# Patient Record
Sex: Female | Born: 1965 | Race: White | Hispanic: No | Marital: Married | Smoking: Current some day smoker
Health system: Southern US, Community
[De-identification: ages and names within clinical notes are randomized; demographics above are authoritative.]

## PROBLEM LIST (undated history)

## (undated) DIAGNOSIS — Z8489 Family history of other specified conditions: Secondary | ICD-10-CM

## (undated) DIAGNOSIS — M1990 Unspecified osteoarthritis, unspecified site: Secondary | ICD-10-CM

## (undated) DIAGNOSIS — R51 Headache: Secondary | ICD-10-CM

## (undated) DIAGNOSIS — R42 Dizziness and giddiness: Secondary | ICD-10-CM

## (undated) DIAGNOSIS — Z9889 Other specified postprocedural states: Secondary | ICD-10-CM

## (undated) DIAGNOSIS — R519 Headache, unspecified: Secondary | ICD-10-CM

## (undated) DIAGNOSIS — E039 Hypothyroidism, unspecified: Secondary | ICD-10-CM

## (undated) DIAGNOSIS — R112 Nausea with vomiting, unspecified: Secondary | ICD-10-CM

## (undated) HISTORY — PX: HYSTERECTOMY ABDOMINAL WITH SALPINGECTOMY: SHX6725

## (undated) HISTORY — PX: WRIST SURGERY: SHX841

## (undated) HISTORY — PX: TONSILLECTOMY: SUR1361

---

## 2004-03-18 ENCOUNTER — Ambulatory Visit: Payer: Self-pay | Admitting: Specialist

## 2004-10-21 ENCOUNTER — Ambulatory Visit: Payer: Self-pay | Admitting: Physician Assistant

## 2005-02-23 ENCOUNTER — Ambulatory Visit: Payer: Self-pay | Admitting: Unknown Physician Specialty

## 2006-03-01 ENCOUNTER — Emergency Department: Payer: Self-pay | Admitting: Emergency Medicine

## 2006-08-12 ENCOUNTER — Emergency Department: Payer: Self-pay | Admitting: Unknown Physician Specialty

## 2006-08-12 ENCOUNTER — Other Ambulatory Visit: Payer: Self-pay

## 2007-08-06 ENCOUNTER — Ambulatory Visit: Payer: Self-pay | Admitting: Internal Medicine

## 2007-08-09 ENCOUNTER — Ambulatory Visit: Payer: Self-pay | Admitting: Internal Medicine

## 2007-12-19 ENCOUNTER — Ambulatory Visit: Payer: Self-pay | Admitting: Specialist

## 2008-07-01 DIAGNOSIS — K219 Gastro-esophageal reflux disease without esophagitis: Secondary | ICD-10-CM | POA: Insufficient documentation

## 2008-07-01 DIAGNOSIS — Z720 Tobacco use: Secondary | ICD-10-CM | POA: Insufficient documentation

## 2008-07-08 ENCOUNTER — Ambulatory Visit: Payer: Self-pay | Admitting: Family Medicine

## 2008-08-07 DIAGNOSIS — E782 Mixed hyperlipidemia: Secondary | ICD-10-CM | POA: Insufficient documentation

## 2008-08-07 DIAGNOSIS — E039 Hypothyroidism, unspecified: Secondary | ICD-10-CM | POA: Insufficient documentation

## 2008-08-11 ENCOUNTER — Ambulatory Visit: Payer: Self-pay | Admitting: Family Medicine

## 2008-09-23 ENCOUNTER — Ambulatory Visit: Payer: Self-pay | Admitting: Family Medicine

## 2008-10-09 DIAGNOSIS — F419 Anxiety disorder, unspecified: Secondary | ICD-10-CM | POA: Insufficient documentation

## 2008-10-22 ENCOUNTER — Ambulatory Visit: Payer: Self-pay | Admitting: Unknown Physician Specialty

## 2009-01-19 ENCOUNTER — Ambulatory Visit: Payer: Self-pay | Admitting: Family Medicine

## 2009-02-10 ENCOUNTER — Ambulatory Visit: Payer: Self-pay | Admitting: Family Medicine

## 2009-09-02 DIAGNOSIS — J309 Allergic rhinitis, unspecified: Secondary | ICD-10-CM | POA: Insufficient documentation

## 2009-11-17 ENCOUNTER — Ambulatory Visit: Payer: Self-pay | Admitting: Family Medicine

## 2010-05-25 ENCOUNTER — Ambulatory Visit: Payer: Self-pay | Admitting: Family Medicine

## 2010-08-08 IMAGING — CT CT CHEST W/ CM
1 series · 15 of 34 positions shown, 19 images · IV contrast (APPLIED)
Comparison: none

REASON FOR EXAM: CP   shortness of breath
COMMENTS:

[Series 8: soft tissue · axial · 0.80mm/px · z∈[-624,-381]mm · 15 of 95 slices shown, 19 images]
[im 7/95  mediastinal]
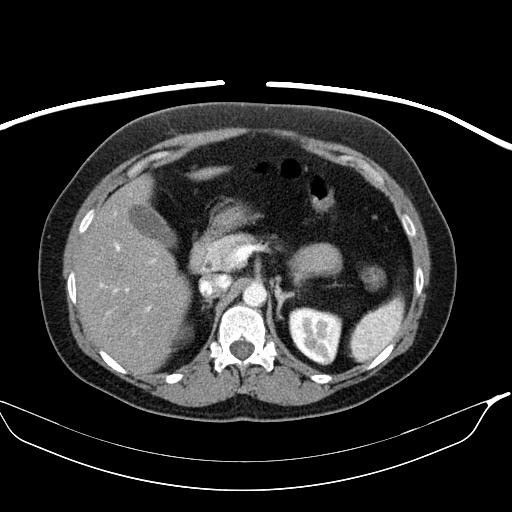
[im 7/95  lung]
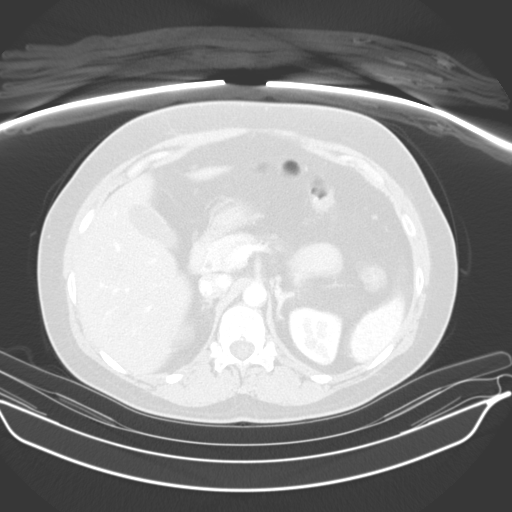
[im 14/95  lung]
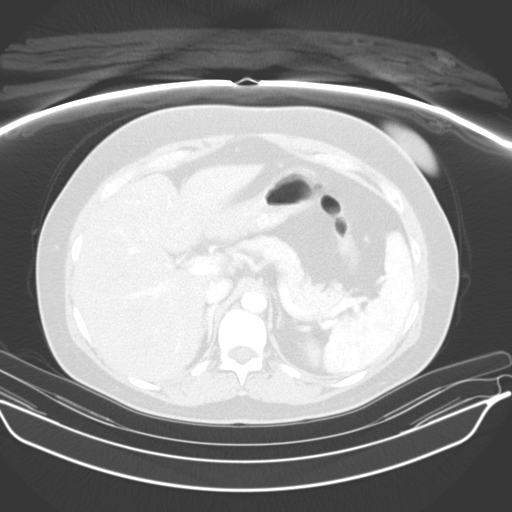
[im 19/95  lung]
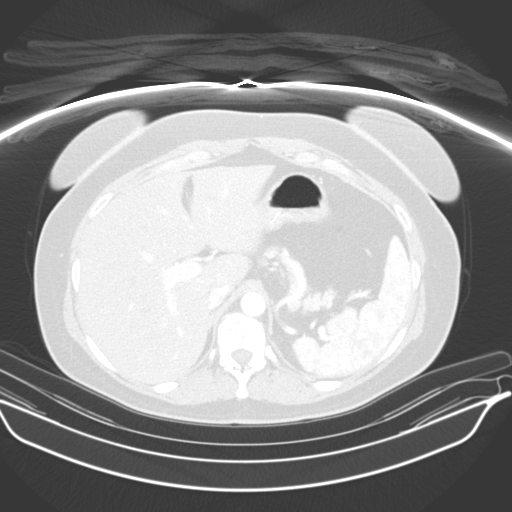
[im 25/95  lung]
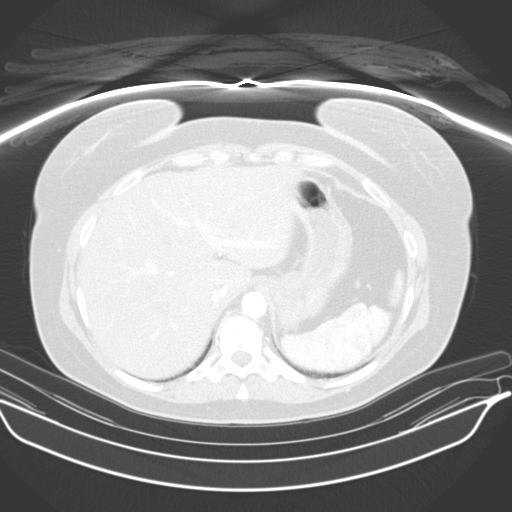
[im 32/95  mediastinal]
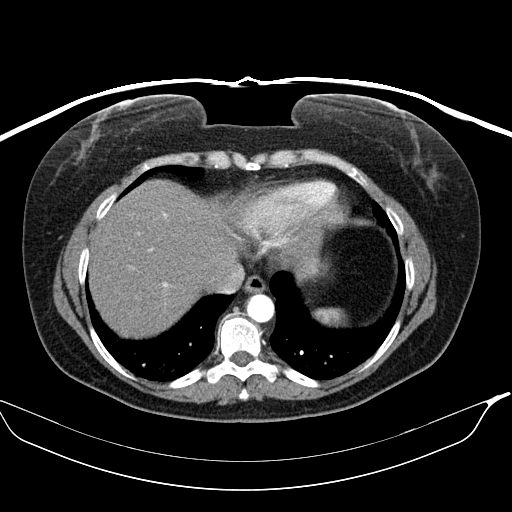
[im 32/95  lung]
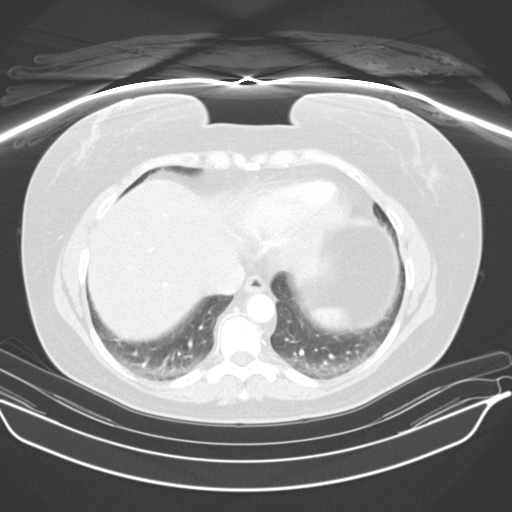
[im 38/95  lung]
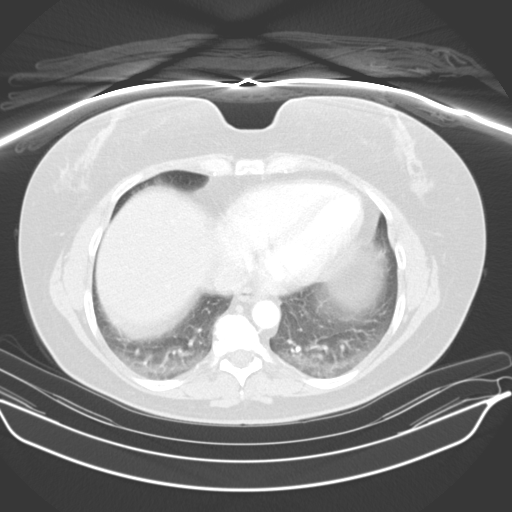
[im 42/95  lung]
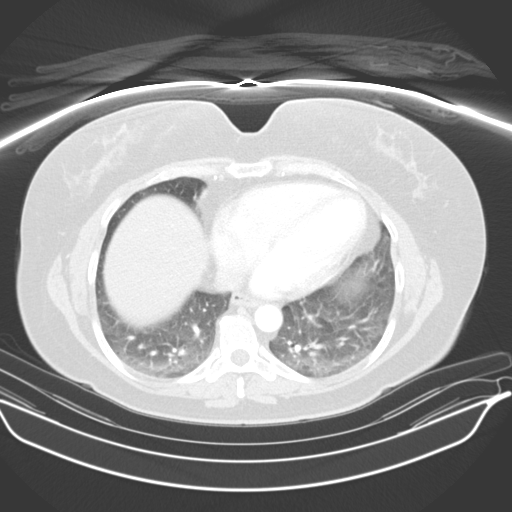
[im 49/95  lung]
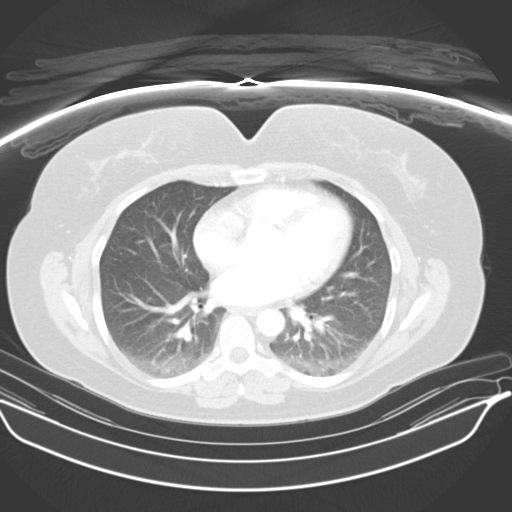
[im 53/95  mediastinal]
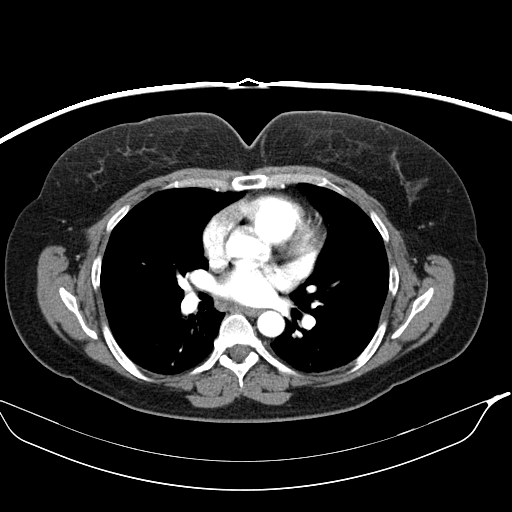
[im 53/95  lung]
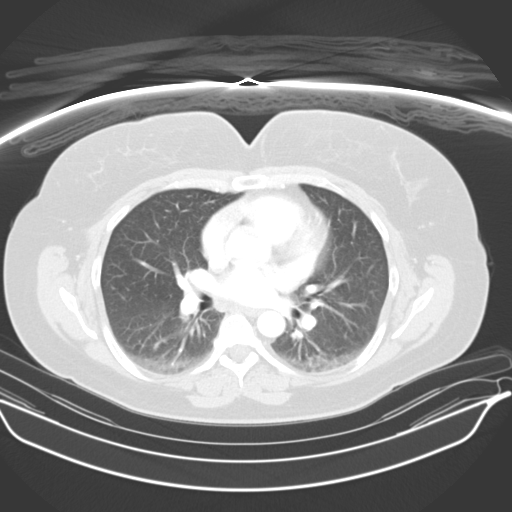
[im 57/95  lung]
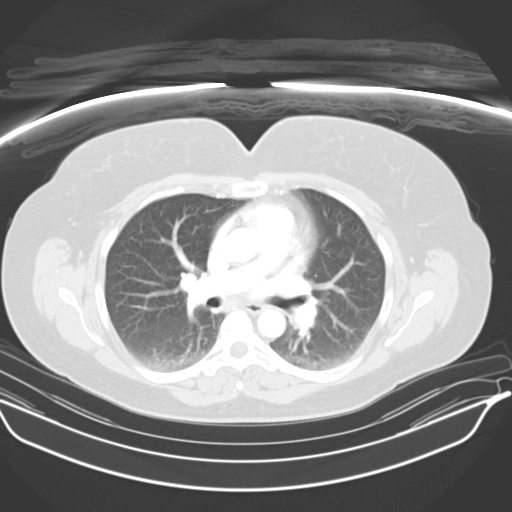
[im 63/95  lung]
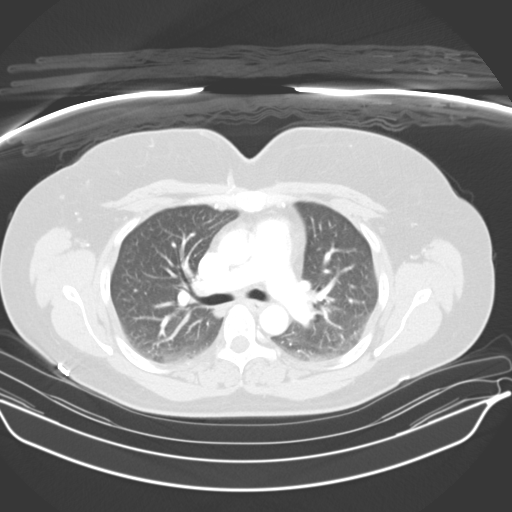
[im 70/95  lung]
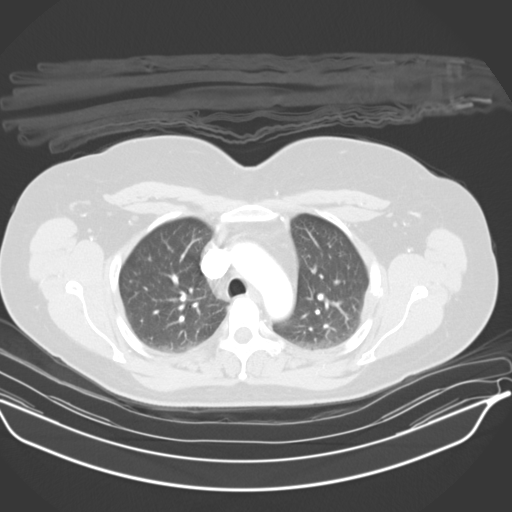
[im 76/95  mediastinal]
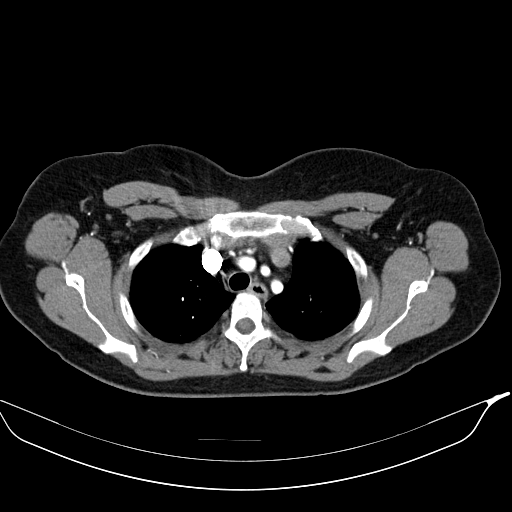
[im 76/95  lung]
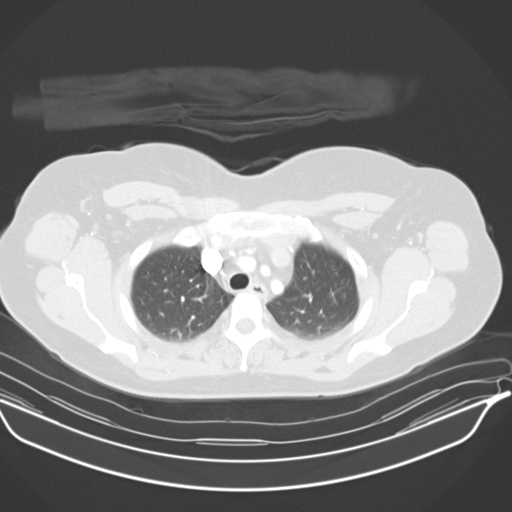
[im 81/95  lung]
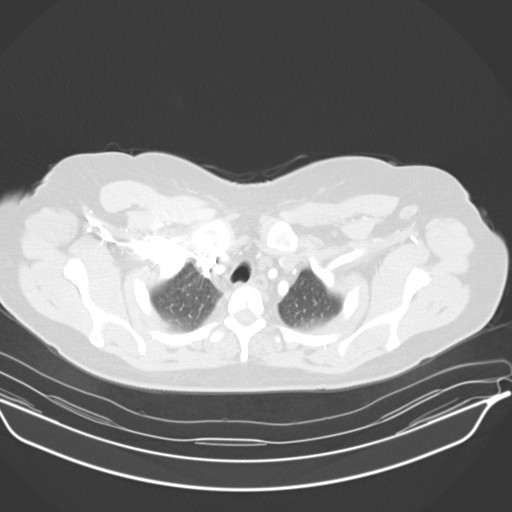
[im 88/95  lung]
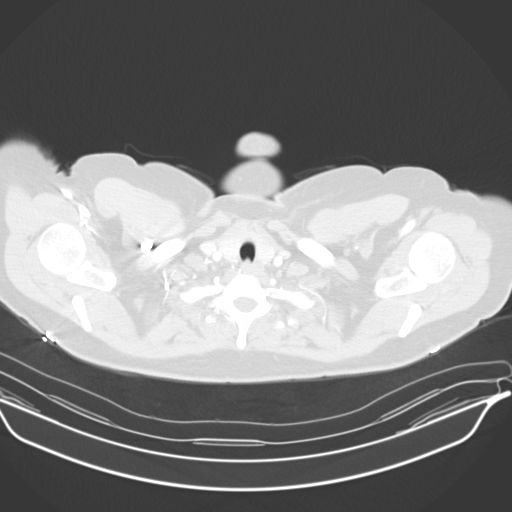

[15 of 34 positions shown; findings below may reference images not displayed]

PROCEDURE:     LUKENS - LUKENS CHEST ( FOR PE ) W  - February 10, 2009  [DATE]

RESULT:     CT of the chest is performed utilizing a pulmonary embolism
protocol and 100 ml of [id number] iodinated intravenous contrast. There is
no previous examination for comparison.

Lung window images demonstrate respiratory motion artifact and dependent
atelectasis. A discrete pulmonary mass is not evident. No endobronchial
lesion is demonstrated. The thoracic aorta opacifies normally without
evidence of aneurysm or dissection. The pulmonary arteries are also well
opacified and demonstrate no filling defect to suggest a pulmonary embolism.
There is no mediastinal or hilar mass or adenopathy. The thyroid lobes
appear to enhance normally. There is no pleural or pericardial effusion. The
included portions of the upper abdomen appear within normal limits.
IMPRESSION: 1.     No aortic aneurysm, aortic dissection or pulmonary embolism is
demonstrated. There is respiratory motion artifact and dependent atelectasis.

## 2011-11-20 IMAGING — CR DG LUMBAR SPINE 2-3V
1 series · 3 of 3 positions shown · non-contrast
Comparison: none

REASON FOR EXAM: back pain
COMMENTS:

PROCEDURE:     KDR - KDXR LUMBAR SPINE AP AND LATERAL  - May 25, 2010  [DATE]
RESULT:     The lumbar vertebral bodies are preserved in height. The
intervertebral disc space heights are well-maintained. I see no pars defects
nor spondylolisthesis. The SI joints are normal in appearance.

[Series 1: view not recorded · 0.17mm/px · 3 of 3 slices shown]
[im 1/3]
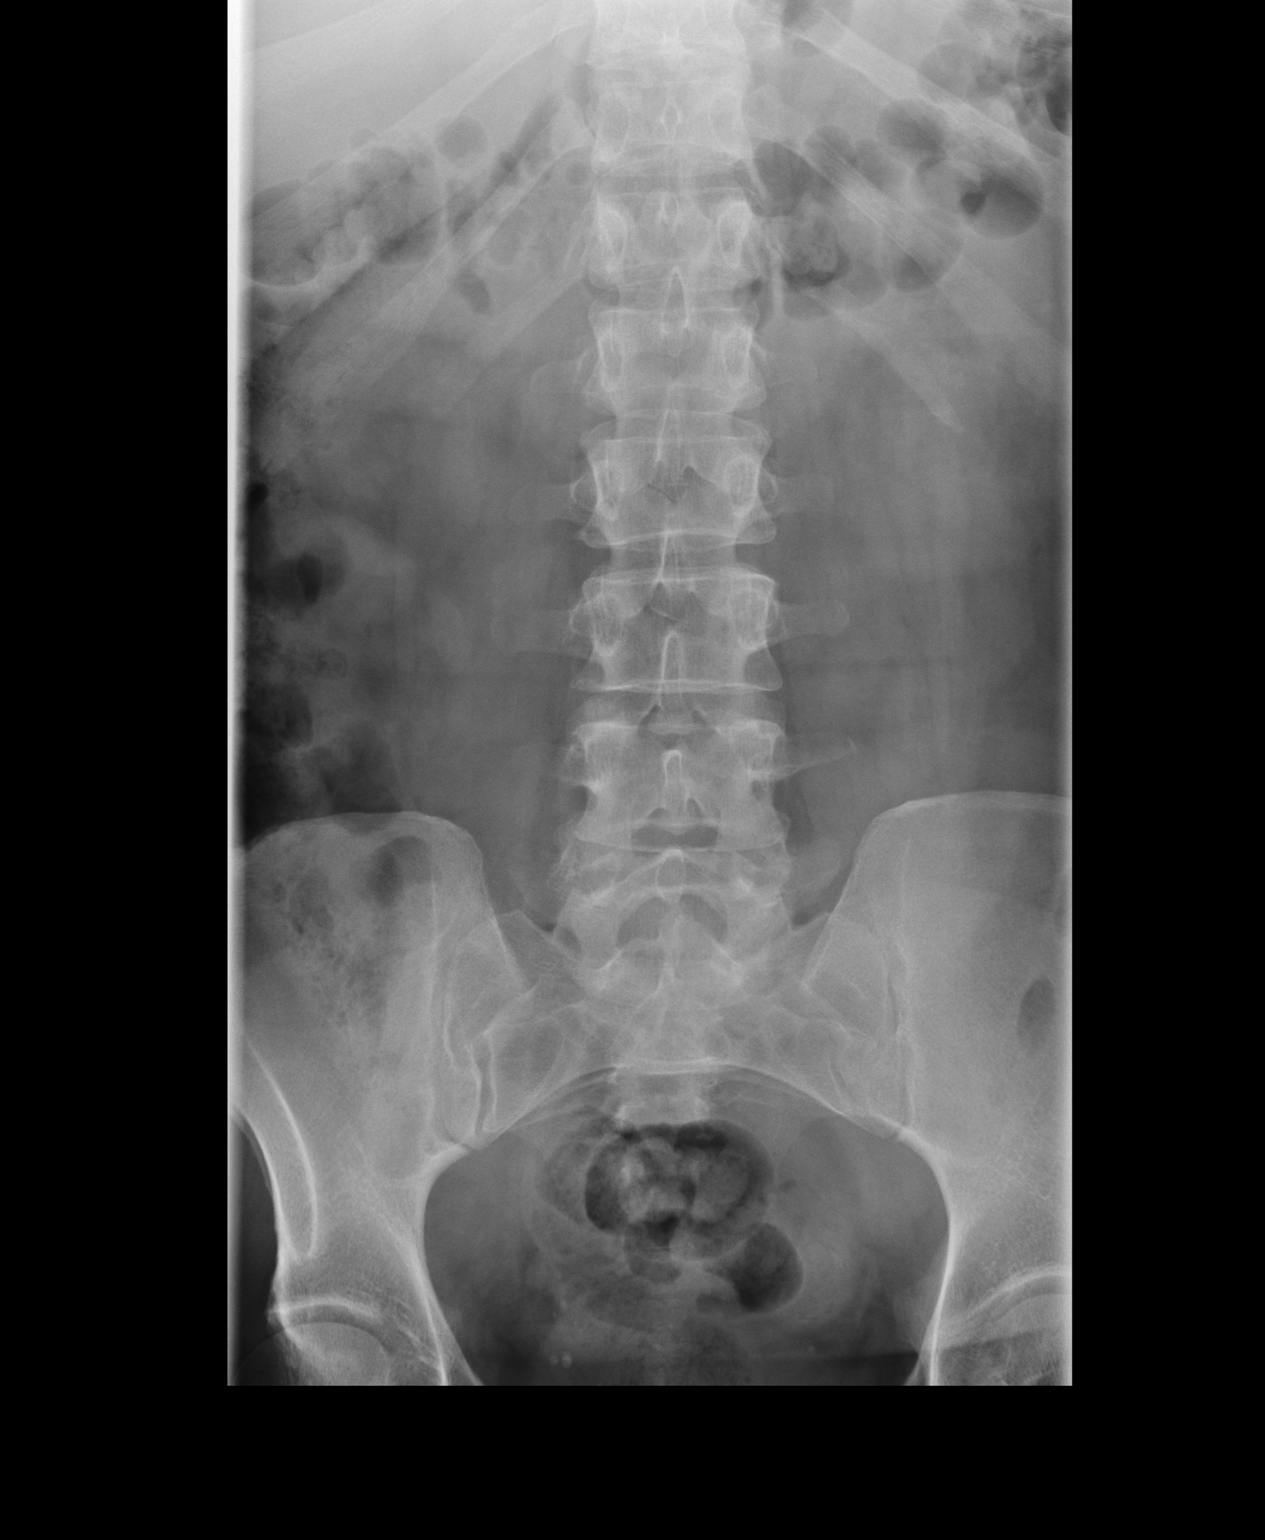
[im 2/3]
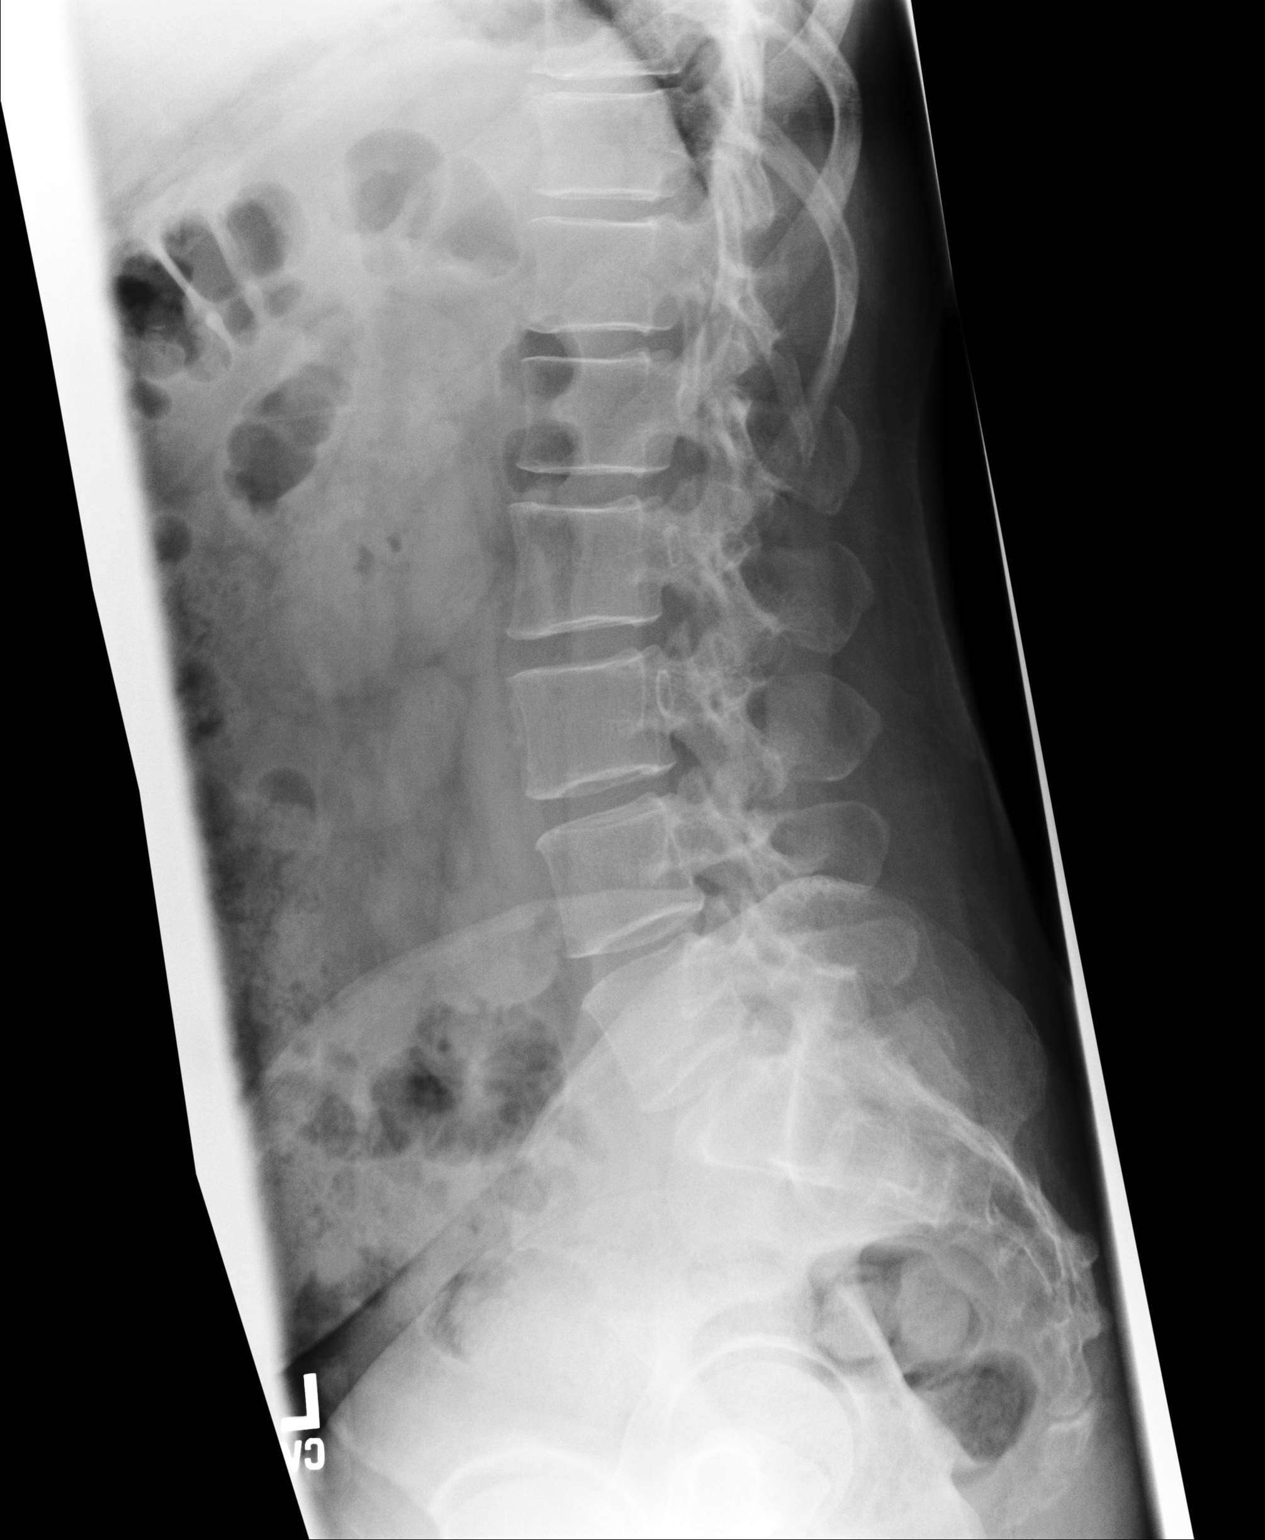
[im 3/3]
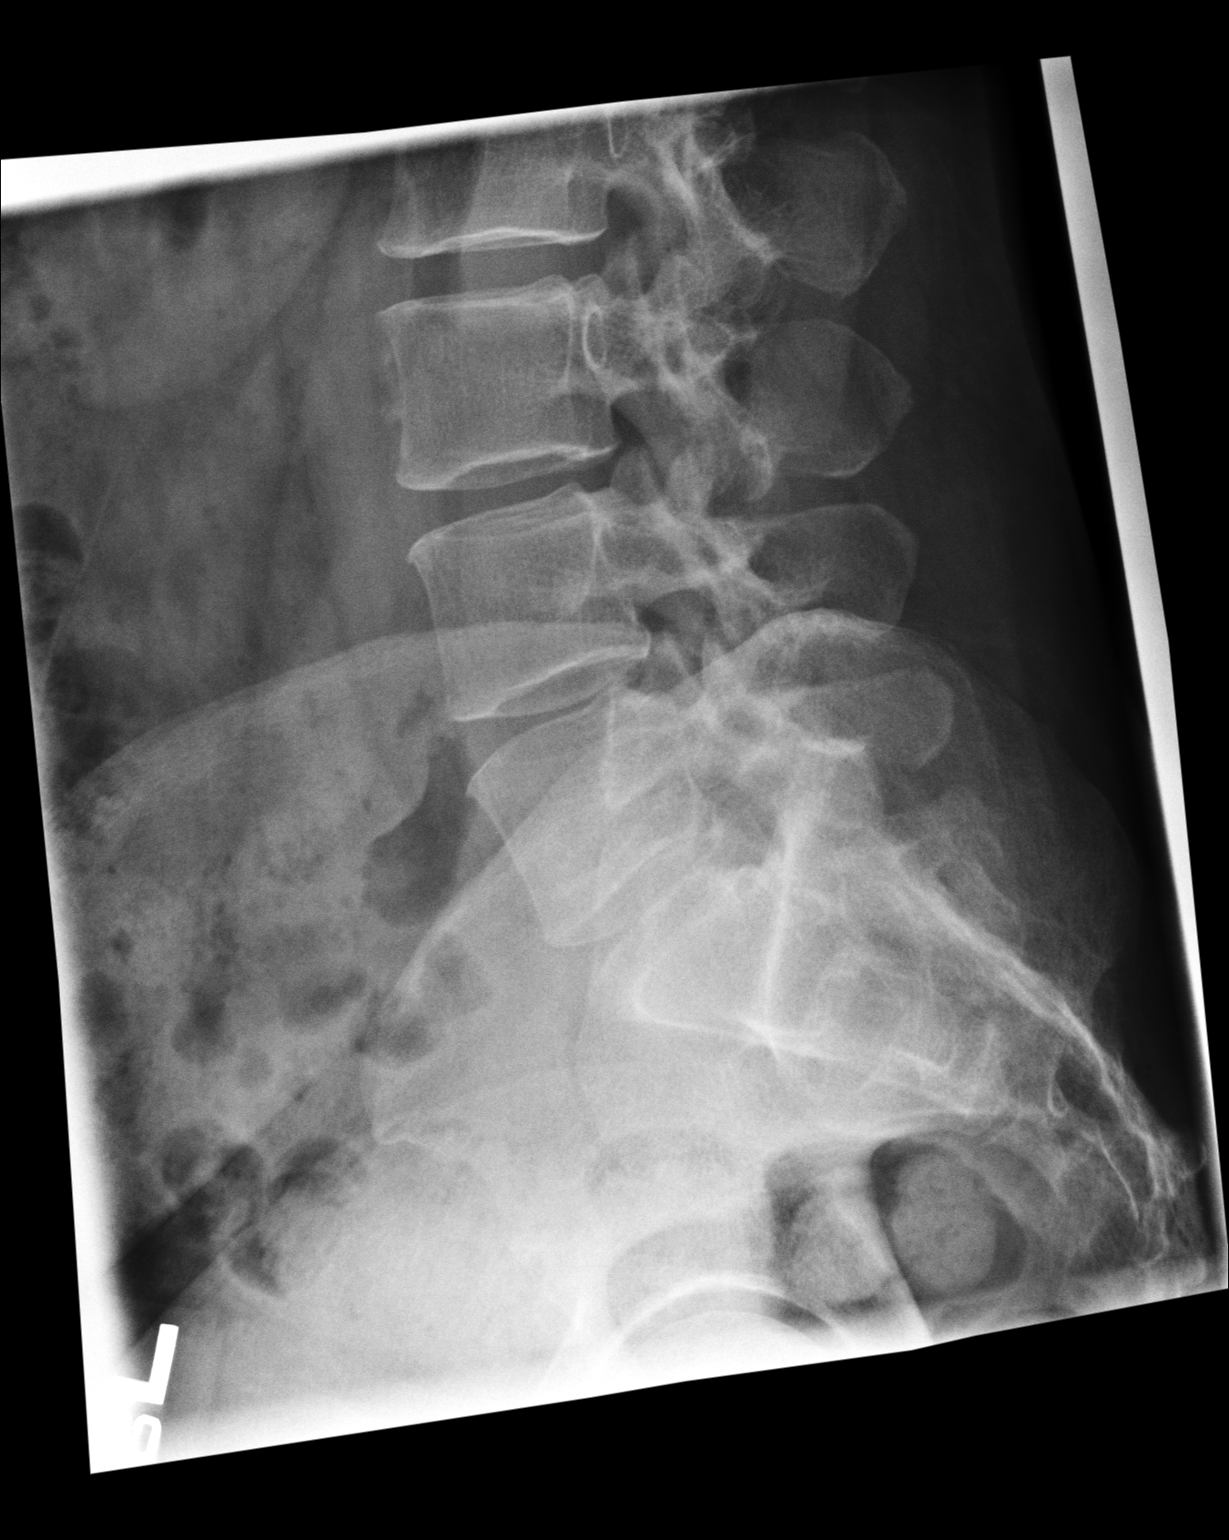

[3 of 3 positions shown; findings below may reference images not displayed]

IMPRESSION: I see no acute bony abnormality of the lumbar spine. If
there are radicular symptoms, followup MRI may be useful.

## 2012-01-24 ENCOUNTER — Ambulatory Visit: Payer: Self-pay | Admitting: Family Medicine

## 2014-09-15 ENCOUNTER — Ambulatory Visit
Admit: 2014-09-15 | Disposition: A | Discharge status: Home / Self Care | Payer: Self-pay | Attending: Family Medicine | Admitting: Family Medicine

## 2014-11-17 ENCOUNTER — Other Ambulatory Visit: Payer: Self-pay | Admitting: Family Medicine

## 2014-11-17 DIAGNOSIS — G4700 Insomnia, unspecified: Secondary | ICD-10-CM | POA: Insufficient documentation

## 2014-11-17 NOTE — Telephone Encounter (Signed)
Printed, please fax or call in to pharmacy. Thank you.   

## 2015-03-05 ENCOUNTER — Other Ambulatory Visit: Payer: Self-pay

## 2015-03-05 DIAGNOSIS — E039 Hypothyroidism, unspecified: Secondary | ICD-10-CM

## 2015-03-05 MED ORDER — LEVOTHYROXINE SODIUM 75 MCG PO TABS: 75.0000 ug | ORAL_TABLET | Freq: Every day | ORAL | Status: DC

## 2015-03-05 NOTE — Telephone Encounter (Signed)
Last OV 05/2014  Thanks,   -Laura  

## 2015-05-30 ENCOUNTER — Other Ambulatory Visit: Payer: Self-pay | Admitting: Family Medicine

## 2015-05-30 NOTE — Telephone Encounter (Signed)
Printed, please fax or call in to pharmacy. Thank you.  Patient needs ov in next few months.

## 2015-07-14 ENCOUNTER — Other Ambulatory Visit: Payer: Self-pay | Admitting: Family Medicine

## 2015-07-14 DIAGNOSIS — G4700 Insomnia, unspecified: Secondary | ICD-10-CM

## 2015-07-14 NOTE — Telephone Encounter (Signed)
Last OV 05/2014  Thanks,   -Laura  

## 2015-07-14 NOTE — Telephone Encounter (Signed)
Printed, please fax or call in to pharmacy. Thank you.   

## 2016-03-05 ENCOUNTER — Other Ambulatory Visit: Payer: Self-pay | Admitting: Family Medicine

## 2016-03-05 DIAGNOSIS — E039 Hypothyroidism, unspecified: Secondary | ICD-10-CM

## 2016-04-18 ENCOUNTER — Other Ambulatory Visit: Payer: Self-pay | Admitting: Family Medicine

## 2016-04-18 DIAGNOSIS — E039 Hypothyroidism, unspecified: Secondary | ICD-10-CM

## 2016-04-20 ENCOUNTER — Other Ambulatory Visit: Payer: Self-pay

## 2016-04-20 ENCOUNTER — Encounter: Payer: Self-pay | Admitting: Physician Assistant

## 2016-04-20 ENCOUNTER — Ambulatory Visit (INDEPENDENT_AMBULATORY_CARE_PROVIDER_SITE_OTHER): Payer: Self-pay | Admitting: Physician Assistant

## 2016-04-20 VITALS — BP 142/94 | HR 72 | Temp 97.9°F | Resp 16 | Ht 62.0 in | Wt 163.0 lb

## 2016-04-20 DIAGNOSIS — Z1211 Encounter for screening for malignant neoplasm of colon: Secondary | ICD-10-CM

## 2016-04-20 DIAGNOSIS — G5600 Carpal tunnel syndrome, unspecified upper limb: Secondary | ICD-10-CM | POA: Insufficient documentation

## 2016-04-20 DIAGNOSIS — F32A Depression, unspecified: Secondary | ICD-10-CM | POA: Insufficient documentation

## 2016-04-20 DIAGNOSIS — F329 Major depressive disorder, single episode, unspecified: Secondary | ICD-10-CM | POA: Insufficient documentation

## 2016-04-20 DIAGNOSIS — R197 Diarrhea, unspecified: Secondary | ICD-10-CM

## 2016-04-20 DIAGNOSIS — E039 Hypothyroidism, unspecified: Secondary | ICD-10-CM

## 2016-04-20 DIAGNOSIS — F419 Anxiety disorder, unspecified: Secondary | ICD-10-CM

## 2016-04-20 DIAGNOSIS — R7303 Prediabetes: Secondary | ICD-10-CM | POA: Insufficient documentation

## 2016-04-20 MED ORDER — LEVOTHYROXINE SODIUM 75 MCG PO TABS: 75.0000 ug | ORAL_TABLET | Freq: Every day | ORAL | 3 refills | Status: DC

## 2016-04-20 NOTE — Progress Notes (Signed)
Patient: Mary Miller Female    DOB: Feb 21, 1966   50 y.o.   MRN: [id number] Visit Date: 04/20/2016  Today's Provider: Trinna Post, PA-C   Chief Complaint  Patient presents with  . Hypothyroidism  . Diarrhea  . Hyperlipidemia   Subjective:    Diarrhea   This is a new problem. The current episode started more than 1 month ago (Started about six weeks ago.). The problem occurs 5 to 10 times per day. The problem has been gradually improving (Pt reports it has improved to 1-2 times a day). The patient states that diarrhea does not awaken her from sleep. Associated symptoms include abdominal pain, bloating and sweats. Pertinent negatives include no chills, fever, headaches or vomiting. There are no known risk factors. She has tried bismuth subsalicylate for the symptoms. The treatment provided no relief.  Thyroid Problem  Presents for follow-up visit. Symptoms include diaphoresis and diarrhea. Patient reports no cold intolerance, constipation, fatigue, heat intolerance or leg swelling. Symptom course: Pt reports she has not been taking her medications everyday.   Patient is a 50 y/o female with history of hypothyroidism on 75 mcg synthroid daily and anxiety. She has been skipping some days of synthroid due to financial reasons. For about three weeks, she has taken it every other day. For the last week, she has taken the prescribed dose daily.   Patient also here for diarrhea that's been going on for six week. She reports she normally has Bm every two days. Past 7-8 weeks using bathroom 5-6 times a day watery diarrhea. No blood, no mucous, no weight loss. No hx of bowel cancer. Bowel movements slowing down, but still diarrhea. Patient reports she has abdominal cramping and bloating with bowel movements. Scared to eat and to have diarrhea. Not tried anything. Mocha frappe and corn have produced symptoms, but other than that cannot pinpoint trigger foods.    No Known  Allergies   Current Outpatient Prescriptions:  .  levothyroxine (SYNTHROID, LEVOTHROID) 75 MCG tablet, Take 1 tablet (75 mcg total) by mouth daily., Disp: 90 tablet, Rfl: 3  Review of Systems  Constitutional: Positive for diaphoresis. Negative for activity change, appetite change, chills, fatigue, fever and unexpected weight change.  Respiratory: Negative.   Cardiovascular: Negative.   Gastrointestinal: Positive for abdominal distention, abdominal pain, bloating and diarrhea. Negative for anal bleeding, blood in stool, constipation, nausea, rectal pain and vomiting.       Also has heartburn   Endocrine: Positive for polydipsia. Negative for cold intolerance, heat intolerance, polyphagia and polyuria.  Musculoskeletal: Negative.  Negative for neck pain and neck stiffness.  Neurological: Positive for dizziness (Does have a history of Vertigo). Negative for light-headedness and headaches.    Social History  Substance Use Topics  . Smoking status: Current Every Day Smoker    Packs/day: 0.25    Years: 25.00    Types: Cigarettes  . Smokeless tobacco: Never Used  . Alcohol use Yes     Comment: Socially   Objective:   BP (!) 142/94 (BP Location: Left Arm, Patient Position: Sitting, Cuff Size: Normal)   Pulse 72   Temp 97.9 F (36.6 C) (Oral)   Resp 16   Ht 5\' 2"  (1.575 m)   Wt 163 lb (73.9 kg)   BMI 29.81 kg/m   Physical Exam  Constitutional: She is oriented to person, place, and time. She appears well-developed and well-nourished.  Neck: Neck supple. No tracheal deviation present. No thyromegaly  present.  Cardiovascular: Normal rate and regular rhythm.   Pulmonary/Chest: Effort normal and breath sounds normal.  Abdominal: Soft. Bowel sounds are normal. She exhibits distension. There is tenderness.  Tender to palpation across entire lower abdominal region.  Lymphadenopathy:    She has no cervical adenopathy.  Neurological: She is alert and oriented to person, place, and time.   Skin: Skin is warm and dry.  Psychiatric: She has a normal mood and affect. Her behavior is normal.        Assessment & Plan:      Problem List Items Addressed This Visit      Endocrine   Adult hypothyroidism - Primary   Relevant Medications   levothyroxine (SYNTHROID, LEVOTHROID) 75 MCG tablet   Other Relevant Orders   TSH     Other   Anxiety   Relevant Orders   TSH    Other Visit Diagnoses    Colon cancer screening       Relevant Orders   Ambulatory referral to Gastroenterology   Diarrhea, unspecified type         Hypothyroidism  Patient has not had TSH drawn in 2015. Will draw today. Patient inconsistently taking synthroid. Have refilled but may change dose based on level.  Colon Cancer Screening  Have referred for colonoscopy as patient is 60. Also in light of diarrhea symptoms  Diarrhea  Patient will keep food and symptom log. Will try dairy elimination and gluten free diet. Instructed patient she may use fiber/Metamucil as bulking agent. May use immodium if this does not work. Will followup when patient schedules annual physical exam.  Return if symptoms worsen or fail to improve, for CPE .  The entirety of the information documented in the History of Present Illness, Review of Systems and Physical Exam were personally obtained by me. Portions of this information were initially documented by Ashley Royalty, CMA and reviewed by me for thoroughness and accuracy.     Patient Instructions  Diarrhea, Adult Introduction Diarrhea is when you have loose and water poop (stool) often. Diarrhea can make you feel weak and cause you to get dehydrated. Dehydration can make you tired and thirsty, make you have a dry mouth, and make it so you pee (urinate) less often. Diarrhea often lasts 2-3 days. However, it can last longer if it is a sign of something more serious. It is important to treat your diarrhea as told by your doctor. Follow these instructions at home: Eating and  drinking Follow these recommendations as told by your doctor:  Take an oral rehydration solution (ORS). This is a drink that is sold at pharmacies and stores.  Drink clear fluids, such as:  Water.  Ice chips.  Diluted fruit juice.  Low-calorie sports drinks.  Eat bland, easy-to-digest foods in small amounts as you are able. These foods include:  Bananas.  Applesauce.  Rice.  Low-fat (lean) meats.  Toast.  Crackers.  Avoid drinking fluids that have a lot of sugar or caffeine in them.  Avoid alcohol.  Avoid spicy or fatty foods. General instructions  Drink enough fluid to keep your pee (urine) clear or pale yellow.  Wash your hands often. If you cannot use soap and water, use hand sanitizer.  Make sure that all people in your home wash their hands well and often.  Take over-the-counter and prescription medicines only as told by your doctor.  Rest at home while you get better.  Watch your condition for any changes.  Take a warm  bath to help with any burning or pain from having diarrhea.  Keep all follow-up visits as told by your doctor. This is important. Contact a doctor if:  You have a fever.  Your diarrhea gets worse.  You have new symptoms.  You cannot keep fluids down.  You feel light-headed or dizzy.  You have a headache.  You have muscle cramps. Get help right away if:  You have chest pain.  You feel very weak or you pass out (faint).  You have bloody or black poop or poop that look like tar.  You have very bad pain, cramping, or bloating in your belly (abdomen).  You have trouble breathing or you are breathing very quickly.  Your heart is beating very quickly.  Your skin feels cold and clammy.  You feel confused.  You have signs of dehydration, such as:  Dark pee, hardly any pee, or no pee.  Cracked lips.  Dry mouth.  Sunken eyes.  Sleepiness.  Weakness. This information is not intended to replace advice given to  you by your health care provider. Make sure you discuss any questions you have with your health care provider. Document Released: 10/26/2007 Document Revised: 11/27/2015 Document Reviewed: 01/13/2015  2017 [place], PA-C  [facility]

## 2016-04-20 NOTE — Patient Instructions (Signed)
Diarrhea, Adult °Introduction °Diarrhea is when you have loose and water poop (stool) often. Diarrhea can make you feel weak and cause you to get dehydrated. Dehydration can make you tired and thirsty, make you have a dry mouth, and make it so you pee (urinate) less often. Diarrhea often lasts 2-3 days. However, it can last longer if it is a sign of something more serious. It is important to treat your diarrhea as told by your doctor. °Follow these instructions at home: °Eating and drinking °Follow these recommendations as told by your doctor: °· Take an oral rehydration solution (ORS). This is a drink that is sold at pharmacies and stores. °· Drink clear fluids, such as: °¨ Water. °¨ Ice chips. °¨ Diluted fruit juice. °¨ Low-calorie sports drinks. °· Eat bland, easy-to-digest foods in small amounts as you are able. These foods include: °¨ Bananas. °¨ Applesauce. °¨ Rice. °¨ Low-fat (lean) meats. °¨ Toast. °¨ Crackers. °· Avoid drinking fluids that have a lot of sugar or caffeine in them. °· Avoid alcohol. °· Avoid spicy or fatty foods. °General instructions °· Drink enough fluid to keep your pee (urine) clear or pale yellow. °· Wash your hands often. If you cannot use soap and water, use hand sanitizer. °· Make sure that all people in your home wash their hands well and often. °· Take over-the-counter and prescription medicines only as told by your doctor. °· Rest at home while you get better. °· Watch your condition for any changes. °· Take a warm bath to help with any burning or pain from having diarrhea. °· Keep all follow-up visits as told by your doctor. This is important. °Contact a doctor if: °· You have a fever. °· Your diarrhea gets worse. °· You have new symptoms. °· You cannot keep fluids down. °· You feel light-headed or dizzy. °· You have a headache. °· You have muscle cramps. °Get help right away if: °· You have chest pain. °· You feel very weak or you pass out (faint). °· You have bloody or black  poop or poop that look like tar. °· You have very bad pain, cramping, or bloating in your belly (abdomen). °· You have trouble breathing or you are breathing very quickly. °· Your heart is beating very quickly. °· Your skin feels cold and clammy. °· You feel confused. °· You have signs of dehydration, such as: °¨ Dark pee, hardly any pee, or no pee. °¨ Cracked lips. °¨ Dry mouth. °¨ Sunken eyes. °¨ Sleepiness. °¨ Weakness. °This information is not intended to replace advice given to you by your health care provider. Make sure you discuss any questions you have with your health care provider. °Document Released: 10/26/2007 Document Revised: 11/27/2015 Document Reviewed: 01/13/2015 °© 2017 [employer] ° °

## 2016-04-21 ENCOUNTER — Telehealth: Payer: Self-pay

## 2016-04-21 LAB — TSH: TSH: 24.84 u[IU]/mL — ABNORMAL HIGH (ref 0.450–4.500)

## 2016-04-21 NOTE — Telephone Encounter (Signed)
-----   Message from Trinna Post, [place] sent at 04/21/2016  8:28 AM EST ----- Patient's TSH was 24. Upper limit of normal is 4.5. This indicates hypothyroid state. Patient had not been taking medications regularly. Would like to reinitiate 75 mcg dose that I filled yesterday and would like patient to schedule follow up visit in 4-6 weeks for repeat level and possible dose adjustment. If patient's insurance is available then, she may make this her annual physical exam.

## 2016-04-21 NOTE — Telephone Encounter (Signed)
Pt advised.   Thanks,   -Jaydis Duchene  

## 2016-05-10 ENCOUNTER — Telehealth: Payer: Self-pay

## 2016-05-10 NOTE — Telephone Encounter (Signed)
LVM for patient to call office to schedule Colonoscopy.

## 2016-05-18 ENCOUNTER — Telehealth: Payer: Self-pay

## 2016-05-18 NOTE — Telephone Encounter (Signed)
Left message to call to schedule colonoscopy.

## 2016-06-02 ENCOUNTER — Encounter: Payer: Self-pay | Admitting: Physician Assistant

## 2016-06-02 ENCOUNTER — Ambulatory Visit (INDEPENDENT_AMBULATORY_CARE_PROVIDER_SITE_OTHER): Payer: 59 | Admitting: Physician Assistant

## 2016-06-02 VITALS — BP 128/72 | HR 68 | Temp 98.4°F | Resp 16 | Ht 62.0 in | Wt 165.0 lb

## 2016-06-02 DIAGNOSIS — F419 Anxiety disorder, unspecified: Secondary | ICD-10-CM

## 2016-06-02 DIAGNOSIS — E039 Hypothyroidism, unspecified: Secondary | ICD-10-CM | POA: Diagnosis not present

## 2016-06-02 DIAGNOSIS — E782 Mixed hyperlipidemia: Secondary | ICD-10-CM | POA: Diagnosis not present

## 2016-06-02 DIAGNOSIS — Z720 Tobacco use: Secondary | ICD-10-CM

## 2016-06-02 DIAGNOSIS — Z1231 Encounter for screening mammogram for malignant neoplasm of breast: Secondary | ICD-10-CM

## 2016-06-02 DIAGNOSIS — Z0000 Encounter for general adult medical examination without abnormal findings: Secondary | ICD-10-CM

## 2016-06-02 DIAGNOSIS — Z1239 Encounter for other screening for malignant neoplasm of breast: Secondary | ICD-10-CM

## 2016-06-02 DIAGNOSIS — R7303 Prediabetes: Secondary | ICD-10-CM

## 2016-06-02 DIAGNOSIS — G4700 Insomnia, unspecified: Secondary | ICD-10-CM

## 2016-06-02 DIAGNOSIS — Z90710 Acquired absence of both cervix and uterus: Secondary | ICD-10-CM

## 2016-06-02 MED ORDER — FLUOXETINE HCL 20 MG PO TABS: 20.0000 mg | ORAL_TABLET | Freq: Every day | ORAL | 1 refills | Status: DC

## 2016-06-02 MED ORDER — CLONAZEPAM 0.5 MG PO TABS
0.5000 mg | ORAL_TABLET | Freq: Two times a day (BID) | ORAL | 0 refills | Status: DC | PRN
Start: 2016-06-02 — End: 2017-01-13

## 2016-06-02 NOTE — Patient Instructions (Signed)

## 2016-06-02 NOTE — Progress Notes (Signed)
Patient: Mary Miller, Female    DOB: [birth year], 51 y.o.   MRN: [id number] Visit Date: 06/02/2016  Today's Provider: Trinna Post, PA-C   Chief Complaint  Patient presents with  . Annual Exam   Subjective:    Annual physical exam Mary Miller is a 51 y.o. female who presents today for health maintenance and complete physical exam. She feels fairly well. She does report having some sinus congestion, sore throat for about a week.  She reports exercising some and trying to improve her diet by reducing sugar intake.  She has a history of anxiety and insomnia. She reports having difficulty turning her mind off at night to sleep long enough, and also waking up in the middle of the night and not being able to fall back asleep. She took Ambien for years at one point and then stopped because she didn't want to be dependent. Since then she has taken melatonin, which she also recently stopped because she though it was causing her diarrhea. She has also tried multiple medications including Lexapro, Cymbalta, and Zoloft. She reports these did not work for her, though she is unclear how long she took them for. She works at [employer] and reports that her job is a stressor for her. She also has a son who struggles with anxiety and who lives with her. He has recently been displaced from his therapist, and this has been a cause of concern for the patient. She was on Klonopin 0.'5mg'$  PRN in the past with significant relief. She did attempt counseling at one point as well, though did not connect with the counselor and discontinued after two visits. However, she mentions that when she accompanied her son to his therapy visits, she felt as if his therapist would help her as well.  The patient also has a history of hypothyroidism. She had been taking her Synthroid intermittently because of insurance issues. Her last TSH was 24.840. Today she reports consistently taking her Synthroid. She  denies cold intolerance, excessive weight gain, dry skin, hair loss, and edema.  The patient is currently smoking about a 1/4 pack per day for total of 6.25 pack year smoking history. She is trying to quit and is thinking about using the nicotine patch.  She does have a history of elevated cholesterol. She is attempting dietary modifications and structured exercise. She also has a history of prediabetes.  When she was here previously, she was having diarrhea frequently. She has discontinued her melatonin and this has since stopped. She also has been trying to eliminate dairy as she notices this makes her feel bloated. She is still having some bloating and gas, but this is much less than before. She did not schedule her colonoscopy 2/2 insurance pending, but she will call to the clinic to do so today.  She has had a complete hysterectomy and does not have a cervix, uterus, or ovaries. She does have some vasomotor symptoms such as hot flashes. She does not have any pelvic pain, vaginal bleeding, or vaginal discharge. Her last mammogram that I can see was in 2010 and was normal. Patient reports no abnormalities in her breasts.   -----------------------------------------------------------------   Review of Systems  Constitutional: Positive for fatigue. Negative for activity change, appetite change, chills, diaphoresis, fever and unexpected weight change.  HENT: Positive for congestion and sore throat. Negative for dental problem, drooling, ear discharge, ear pain, facial swelling, hearing loss, mouth sores, nosebleeds, postnasal drip, rhinorrhea, sinus  pain, sinus pressure, sneezing, tinnitus, trouble swallowing and voice change.   Eyes: Negative for photophobia, pain, discharge, redness, itching and visual disturbance.  Respiratory: Negative.   Cardiovascular: Negative.   Gastrointestinal: Positive for abdominal distention. Negative for abdominal pain, anal bleeding, blood in stool, constipation,  diarrhea, nausea, rectal pain and vomiting.  Endocrine: Positive for heat intolerance. Negative for cold intolerance, polydipsia, polyphagia and polyuria.  Genitourinary: Negative.   Musculoskeletal: Positive for back pain. Negative for arthralgias, gait problem, joint swelling, myalgias, neck pain and neck stiffness.  Skin: Negative.   Allergic/Immunologic: Negative.   Neurological: Positive for dizziness. Negative for tremors, seizures, syncope, facial asymmetry, speech difficulty, weakness, light-headedness, numbness and headaches.  Hematological: Negative.   Psychiatric/Behavioral: Negative.     Social History      She  reports that she has been smoking Cigarettes.  She has a 6.25 pack-year smoking history. She has never used smokeless tobacco. She reports that she drinks alcohol. She reports that she does not use drugs.       Social History   Social History  . Marital status: Married    Spouse name: N/A  . Number of children: N/A  . Years of education: N/A   Social History Main Topics  . Smoking status: Current Every Day Smoker    Packs/day: 0.25    Years: 25.00    Types: Cigarettes  . Smokeless tobacco: Never Used  . Alcohol use Yes     Comment: Socially  . Drug use: No  . Sexual activity: Not Asked   Other Topics Concern  . None   Social History Narrative  . None    No past medical history on file.   Patient Active Problem List   Diagnosis Date Noted  . Carpal tunnel syndrome 04/20/2016  . Clinical depression 04/20/2016  . Borderline diabetes 04/20/2016  . Insomnia 11/17/2014  . Allergic rhinitis 09/02/2009  . Anxiety 10/09/2008  . Adult hypothyroidism 08/07/2008  . 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 Current tobacco use 07/01/2008  . Acid reflux 07/01/2008    Past Surgical History:  Procedure Laterality Date  . CESAREAN SECTION     X's 3  . HYSTERECTOMY ABDOMINAL WITH SALPINGECTOMY    . WRIST SURGERY Left    Removed a  cyst    Family History        Family Status  Relation Status  . Mother Alive  . Father Alive        Her family history includes Hypertension in her father and mother.     No Known Allergies   Current Outpatient Prescriptions:  .  clonazePAM (KLONOPIN) 0.5 MG tablet, Take 1 tablet (0.5 mg total) by mouth 2 (two) times daily as needed for anxiety., Disp: 30 tablet, Rfl: 0 .  FLUoxetine (PROZAC) 20 MG tablet, Take 1 tablet (20 mg total) by mouth daily., Disp: 30 tablet, Rfl: 1 .  levothyroxine (SYNTHROID, LEVOTHROID) 75 MCG tablet, Take 1 tablet (75 mcg total) by mouth daily., Disp: 90 tablet, Rfl: 3   Patient Care Team: Trinna Post, PA-C as PCP - General (Physician Assistant)      Objective:   Vitals: BP 128/72 (BP Location: Left Arm, Patient Position: Sitting, Cuff Size: Large)   Pulse 68   Temp 98.4 F (36.9 C) (Oral)   Resp 16   Ht '5\' 2"'$  (1.575 m)   Wt 165 lb (74.8 kg)   BMI 30.18 kg/m    Physical Exam  Constitutional: She  is oriented to person, place, and time. She appears well-developed and well-nourished. No distress.  HENT:  Mouth/Throat: Oropharynx is clear and moist.  Eyes: Conjunctivae are normal.  Neck: Neck supple. No tracheal deviation present. No thyromegaly present.  Cardiovascular: Normal rate, regular rhythm and normal heart sounds.   Pulmonary/Chest: Effort normal and breath sounds normal. No respiratory distress. She has no wheezes. She has no rales. She exhibits no tenderness. Right breast exhibits no inverted nipple, no mass, no nipple discharge, no skin change and no tenderness. Left breast exhibits no inverted nipple, no mass, no nipple discharge, no skin change and no tenderness. Breasts are symmetrical.  Abdominal: Soft. Bowel sounds are normal. She exhibits no distension and no mass. There is no tenderness. There is no rebound.  Genitourinary:  Genitourinary Comments: GU deferred 2/2 hysterectomy  Lymphadenopathy:    She has no cervical  adenopathy.  Neurological: She is alert and oriented to person, place, and time.  Skin: Skin is warm and dry. She is not diaphoretic.  Psychiatric: She has a normal mood and affect. Her behavior is normal.    GAD 7 : Generalized Anxiety Score 06/02/2016  Nervous, Anxious, on Edge 3  Control/stop worrying 3  Worry too much - different things 3  Trouble relaxing 3  Restless 1  Easily annoyed or irritable 3  Afraid - awful might happen 1  Total GAD 7 Score 17  Anxiety Difficulty Very difficult      Depression Screen PHQ 2/9 Scores 06/02/2016  PHQ - 2 Score 0      Assessment & Plan:     Routine Health Maintenance and Physical Exam  Exercise Activities and Dietary recommendations Goals    None       There is no immunization history on file for this patient.  Health Maintenance  Topic Date Due  . HIV Screening  11/05/1979  . TETANUS/TDAP  11/05/1983  . PAP SMEAR  11/04/1985  . MAMMOGRAM  11/05/2014  . COLONOSCOPY  11/05/2014  . INFLUENZA VACCINE  12/22/2015     Discussed health benefits of physical activity, and encouraged her to engage in regular exercise appropriate for her age and condition.    1. Annual physical exam  Had ordered colonoscopy last visit, patient was waiting until insurance came through and it has so she will call today and schedule. Did counsel patient on smoking cessation. She is trying to quit herself but if she finds she cannot do so, we can go over options to help her.  2. Adult hypothyroidism  Labs were elevated last month so will redraw now that patient is consistently taking Synthroid. Seems stable symptomatically.  - TSH  3. Borderline diabetes  Labs as below. Should be fasting.  - Comprehensive metabolic panel - CBC with Differential/Platelet - Lipid panel  4. Combined fat and carbohydrate induced hyperlipemia  Labs as above, should be fasting.  5. Breast cancer screening  Ordered and gave info on [facility]. - MM Digital Screening; Future  6. H/O total hysterectomy  For endometriosis, no PAP required unless concerning symptoms.  7. Insomnia, unspecified type  Spoke about how insomnia seems likely a manifestation of anxiety. See below.  8. Anxiety  Patient has moderate difficulty with her anxiety. Not on any chronic maintenance medications. Will start Prozac as longterm treatment. She has previously tried SSRIs but not sure for how long. Counseled that 8 weeks at the highest dose with no symptomatic improvement would be considered treatment failure. Did a  controlled substance search on [place] and it appears the last time she had Klonopin filled was 08/2014. No history of early refills or suspicious circumstances. I do think patient could benefit from reinitiating this in a sparing matter while initiating a maintenance therapy. I also encouraged patient to think about trying out therapy again as it seems her dissatisfaction with it initially was provider dependent. Counseled on side effects.   - FLUoxetine (PROZAC) 20 MG tablet; Take 1 tablet (20 mg total) by mouth daily.  Dispense: 30 tablet; Refill: 1 - clonazePAM (KLONOPIN) 0.5 MG tablet; Take 1 tablet (0.5 mg total) by mouth 2 (two) times daily as needed for anxiety.  Dispense: 30 tablet; Refill: 0  Return in about 4 weeks (around 06/30/2016) for anxiety, hypothyroid.  Patient Instructions  Health Maintenance, Female Introduction Adopting a healthy lifestyle and getting preventive care can go a long way to promote health and wellness. Talk with your health care provider about what schedule of regular examinations is right for you. This is a good chance for you to check in with your provider about disease prevention and staying healthy. In between checkups, there are plenty of things you can do on your own. Experts have done a lot of research about which lifestyle changes and preventive measures are most likely to keep you healthy. Ask your  health care provider for more information. Weight and diet Eat a healthy diet  Be sure to include plenty of vegetables, fruits, low-fat dairy products, and lean protein.  Do not eat a lot of foods high in solid fats, added sugars, or salt.  Get regular exercise. This is one of the most important things you can do for your health.  Most adults should exercise for at least 150 minutes each week. The exercise should increase your heart rate and make you sweat (moderate-intensity exercise).  Most adults should also do strengthening exercises at least twice a week. This is in addition to the moderate-intensity exercise. Maintain a healthy weight  Body mass index (BMI) is a measurement that can be used to identify possible weight problems. It estimates body fat based on height and weight. Your health care provider can help determine your BMI and help you achieve or maintain a healthy weight.  For females 24 years of age and older:  A BMI below 18.5 is considered underweight.  A BMI of 18.5 to 24.9 is normal.  A BMI of 25 to 29.9 is considered overweight.  A BMI of 30 and above is considered obese. Watch levels of cholesterol and blood lipids  You should start having your blood tested for lipids and cholesterol at 51 years of age, then have this test every 5 years.  You may need to have your cholesterol levels checked more often if:  Your lipid or cholesterol levels are high.  You are older than 52 years of age.  You are at high risk for heart disease. Cancer screening Lung Cancer  Lung cancer screening is recommended for adults 35-35 years old who are at high risk for lung cancer because of a history of smoking.  A yearly low-dose CT scan of the lungs is recommended for people who:  Currently smoke.  Have quit within the past 15 years.  Have at least a 30-pack-year history of smoking. A pack year is smoking an average of one pack of cigarettes a day for 1 year.  Yearly  screening should continue until it has been 15 years since you quit.  Yearly screening  should stop if you develop a health problem that would prevent you from having lung cancer treatment. Breast Cancer  Practice breast self-awareness. This means understanding how your breasts normally appear and feel.  It also means doing regular breast self-exams. Let your health care provider know about any changes, no matter how small.  If you are in your 20s or 30s, you should have a clinical breast exam (CBE) by a health care provider every 1-3 years as part of a regular health exam.  If you are 62 or older, have a CBE every year. Also consider having a breast X-ray (mammogram) every year.  If you have a family history of breast cancer, talk to your health care provider about genetic screening.  If you are at high risk for breast cancer, talk to your health care provider about having an MRI and a mammogram every year.  Breast cancer gene (BRCA) assessment is recommended for women who have family members with BRCA-related cancers. BRCA-related cancers include:  Breast.  Ovarian.  Tubal.  Peritoneal cancers.  Results of the assessment will determine the need for genetic counseling and BRCA1 and BRCA2 testing. Cervical Cancer  Your health care provider may recommend that you be screened regularly for cancer of the pelvic organs (ovaries, uterus, and vagina). This screening involves a pelvic examination, including checking for microscopic changes to the surface of your cervix (Pap test). You may be encouraged to have this screening done every 3 years, beginning at age 45.  For women ages 80-65, health care providers may recommend pelvic exams and Pap testing every 3 years, or they may recommend the Pap and pelvic exam, combined with testing for human papilloma virus (HPV), every 5 years. Some types of HPV increase your risk of cervical cancer. Testing for HPV may also be done on women of any age with  unclear Pap test results.  Other health care providers may not recommend any screening for nonpregnant women who are considered low risk for pelvic cancer and who do not have symptoms. Ask your health care provider if a screening pelvic exam is right for you.  If you have had past treatment for cervical cancer or a condition that could lead to cancer, you need Pap tests and screening for cancer for at least 20 years after your treatment. If Pap tests have been discontinued, your risk factors (such as having a new sexual partner) need to be reassessed to determine if screening should resume. Some women have medical problems that increase the chance of getting cervical cancer. In these cases, your health care provider may recommend more frequent screening and Pap tests. Colorectal Cancer  This type of cancer can be detected and often prevented.  Routine colorectal cancer screening usually begins at 52 years of age and continues through 52 years of age.  Your health care provider may recommend screening at an earlier age if you have risk factors for colon cancer.  Your health care provider may also recommend using home test kits to check for hidden blood in the stool.  A small camera at the end of a tube can be used to examine your colon directly (sigmoidoscopy or colonoscopy). This is done to check for the earliest forms of colorectal cancer.  Routine screening usually begins at age 68.  Direct examination of the colon should be repeated every 5-10 years through 52 years of age. However, you may need to be screened more often if early forms of precancerous polyps or small growths are  found. Skin Cancer  Check your skin from head to toe regularly.  Tell your health care provider about any new moles or changes in moles, especially if there is a change in a mole's shape or color.  Also tell your health care provider if you have a mole that is larger than the size of a pencil eraser.  Always  use sunscreen. Apply sunscreen liberally and repeatedly throughout the day.  Protect yourself by wearing long sleeves, pants, a wide-brimmed hat, and sunglasses whenever you are outside. Heart disease, diabetes, and high blood pressure  High blood pressure causes heart disease and increases the risk of stroke. High blood pressure is more likely to develop in:  People who have blood pressure in the high end of the normal range (130-139/85-89 mm Hg).  People who are overweight or obese.  People who are African American.  If you are 15-38 years of age, have your blood pressure checked every 3-5 years. If you are 31 years of age or older, have your blood pressure checked every year. You should have your blood pressure measured twice-once when you are at a hospital or clinic, and once when you are not at a hospital or clinic. Record the average of the two measurements. To check your blood pressure when you are not at a hospital or clinic, you can use:  An automated blood pressure machine at a pharmacy.  A home blood pressure monitor.  If you are between 69 years and 62 years old, ask your health care provider if you should take aspirin to prevent strokes.  Have regular diabetes screenings. This involves taking a blood sample to check your fasting blood sugar level.  If you are at a normal weight and have a low risk for diabetes, have this test once every three years after 52 years of age.  If you are overweight and have a high risk for diabetes, consider being tested at a younger age or more often. Preventing infection Hepatitis B  If you have a higher risk for hepatitis B, you should be screened for this virus. You are considered at high risk for hepatitis B if:  You were born in a country where hepatitis B is common. Ask your health care provider which countries are considered high risk.  Your parents were born in a high-risk country, and you have not been immunized against hepatitis B  (hepatitis B vaccine).  You have HIV or AIDS.  You use needles to inject street drugs.  You live with someone who has hepatitis B.  You have had sex with someone who has hepatitis B.  You get hemodialysis treatment.  You take certain medicines for conditions, including cancer, organ transplantation, and autoimmune conditions. Hepatitis C  Blood testing is recommended for:  Everyone born from [birth year] through 1966.  Anyone with known risk factors for hepatitis C. Sexually transmitted infections (STIs)  You should be screened for sexually transmitted infections (STIs) including gonorrhea and chlamydia if:  You are sexually active and are younger than 52 years of age.  You are older than 52 years of age and your health care provider tells you that you are at risk for this type of infection.  Your sexual activity has changed since you were last screened and you are at an increased risk for chlamydia or gonorrhea. Ask your health care provider if you are at risk.  If you do not have HIV, but are at risk, it may be recommended that you take a  prescription medicine daily to prevent HIV infection. This is called pre-exposure prophylaxis (PrEP). You are considered at risk if:  You are sexually active and do not regularly use condoms or know the HIV status of your partner(s).  You take drugs by injection.  You are sexually active with a partner who has HIV. Talk with your health care provider about whether you are at high risk of being infected with HIV. If you choose to begin PrEP, you should first be tested for HIV. You should then be tested every 3 months for as long as you are taking PrEP. Pregnancy  If you are premenopausal and you may become pregnant, ask your health care provider about preconception counseling.  If you may become pregnant, take 400 to 800 micrograms (mcg) of folic acid every day.  If you want to prevent pregnancy, talk to your health care provider about birth  control (contraception). Osteoporosis and menopause  Osteoporosis is a disease in which the bones lose minerals and strength with aging. This can result in serious bone fractures. Your risk for osteoporosis can be identified using a bone density scan.  If you are 41 years of age or older, or if you are at risk for osteoporosis and fractures, ask your health care provider if you should be screened.  Ask your health care provider whether you should take a calcium or vitamin D supplement to lower your risk for osteoporosis.  Menopause may have certain physical symptoms and risks.  Hormone replacement therapy may reduce some of these symptoms and risks. Talk to your health care provider about whether hormone replacement therapy is right for you. Follow these instructions at home:  Schedule regular health, dental, and eye exams.  Stay current with your immunizations.  Do not use any tobacco products including cigarettes, chewing tobacco, or electronic cigarettes.  If you are pregnant, do not drink alcohol.  If you are breastfeeding, limit how much and how often you drink alcohol.  Limit alcohol intake to no more than 1 drink per day for nonpregnant women. One drink equals 12 ounces of beer, 5 ounces of wine, or 1 ounces of hard liquor.  Do not use street drugs.  Do not share needles.  Ask your health care provider for help if you need support or information about quitting drugs.  Tell your health care provider if you often feel depressed.  Tell your health care provider if you have ever been abused or do not feel safe at home. This information is not intended to replace advice given to you by your health care provider. Make sure you discuss any questions you have with your health care provider. Document Released: 11/22/2010 Document Revised: 10/15/2015 Document Reviewed: 02/10/2015  2017 [employer]  The entirety of the information documented in the History of Present Illness,  Review of Systems and Physical Exam were personally obtained by me. Portions of this information were initially documented by Ashley Royalty, CMA and reviewed by me for thoroughness and accuracy.     --------------------------------------------------------------------    Trinna Post, PA-C  [facility]

## 2016-06-03 ENCOUNTER — Other Ambulatory Visit: Payer: Self-pay

## 2016-06-03 ENCOUNTER — Telehealth: Payer: Self-pay

## 2016-06-03 NOTE — Telephone Encounter (Signed)
Gastroenterology Pre-Procedure Review  Request Date:  Requesting Physician: Dr.   PATIENT REVIEW QUESTIONS: The patient responded to the following health history questions as indicated:    1. Are you having any GI issues? no 2. Do you have a personal history of Polyps? no 3. Do you have a family history of Colon Cancer or Polyps? no 4. Diabetes Mellitus? no 5. Joint replacements in the past 12 months?no 6. Major health problems in the past 3 months?no 7. Any artificial heart valves, MVP, or defibrillator?no    MEDICATIONS & ALLERGIES:    Patient reports the following regarding taking any anticoagulation/antiplatelet therapy:   Plavix, Coumadin, Eliquis, Xarelto, Lovenox, Pradaxa, Brilinta, or Effient? no Aspirin? no  Patient confirms/reports the following medications:  Current Outpatient Prescriptions  Medication Sig Dispense Refill  . clonazePAM (KLONOPIN) 0.5 MG tablet Take 1 tablet (0.5 mg total) by mouth 2 (two) times daily as needed for anxiety. 30 tablet 0  . FLUoxetine (PROZAC) 20 MG tablet Take 1 tablet (20 mg total) by mouth daily. 30 tablet 1  . levothyroxine (SYNTHROID, LEVOTHROID) 75 MCG tablet Take 1 tablet (75 mcg total) by mouth daily. 90 tablet 3   No current facility-administered medications for this visit.     Patient confirms/reports the following allergies:  No Known Allergies  No orders of the defined types were placed in this encounter.   AUTHORIZATION INFORMATION Primary Insurance: 1D#: Group #:  Secondary Insurance: 1D#: Group #:  SCHEDULE INFORMATION: Date: 06/30/16 Time: Location: [place]

## 2016-06-28 NOTE — Discharge Instructions (Signed)

## 2016-06-30 ENCOUNTER — Ambulatory Visit: Payer: 59 | Admitting: Anesthesiology

## 2016-06-30 ENCOUNTER — Ambulatory Visit
Admission: RE | Admit: 2016-06-30 | Discharge: 2016-06-30 | Disposition: A | Discharge status: Home / Self Care | Payer: 59 | Source: Ambulatory Visit | Attending: Gastroenterology | Admitting: Gastroenterology

## 2016-06-30 ENCOUNTER — Ambulatory Visit: Payer: 59 | Admitting: Physician Assistant

## 2016-06-30 ENCOUNTER — Encounter
Admission: RE | Disposition: A | Discharge status: Home / Self Care | Payer: Self-pay | Source: Ambulatory Visit | Attending: Gastroenterology

## 2016-06-30 DIAGNOSIS — F17210 Nicotine dependence, cigarettes, uncomplicated: Secondary | ICD-10-CM | POA: Diagnosis not present

## 2016-06-30 DIAGNOSIS — M19041 Primary osteoarthritis, right hand: Secondary | ICD-10-CM | POA: Diagnosis not present

## 2016-06-30 DIAGNOSIS — F329 Major depressive disorder, single episode, unspecified: Secondary | ICD-10-CM | POA: Insufficient documentation

## 2016-06-30 DIAGNOSIS — Z79899 Other long term (current) drug therapy: Secondary | ICD-10-CM | POA: Diagnosis not present

## 2016-06-30 DIAGNOSIS — M19042 Primary osteoarthritis, left hand: Secondary | ICD-10-CM | POA: Diagnosis not present

## 2016-06-30 DIAGNOSIS — K640 First degree hemorrhoids: Secondary | ICD-10-CM | POA: Diagnosis not present

## 2016-06-30 DIAGNOSIS — Z90710 Acquired absence of both cervix and uterus: Secondary | ICD-10-CM | POA: Insufficient documentation

## 2016-06-30 DIAGNOSIS — D123 Benign neoplasm of transverse colon: Secondary | ICD-10-CM

## 2016-06-30 DIAGNOSIS — Z8249 Family history of ischemic heart disease and other diseases of the circulatory system: Secondary | ICD-10-CM | POA: Diagnosis not present

## 2016-06-30 DIAGNOSIS — K635 Polyp of colon: Secondary | ICD-10-CM | POA: Diagnosis not present

## 2016-06-30 DIAGNOSIS — E039 Hypothyroidism, unspecified: Secondary | ICD-10-CM | POA: Diagnosis not present

## 2016-06-30 DIAGNOSIS — D125 Benign neoplasm of sigmoid colon: Secondary | ICD-10-CM | POA: Insufficient documentation

## 2016-06-30 DIAGNOSIS — Z1211 Encounter for screening for malignant neoplasm of colon: Secondary | ICD-10-CM

## 2016-06-30 DIAGNOSIS — R42 Dizziness and giddiness: Secondary | ICD-10-CM | POA: Insufficient documentation

## 2016-06-30 DIAGNOSIS — R51 Headache: Secondary | ICD-10-CM | POA: Diagnosis not present

## 2016-06-30 HISTORY — PX: COLONOSCOPY WITH PROPOFOL: SHX5780

## 2016-06-30 HISTORY — PX: POLYPECTOMY: SHX149

## 2016-06-30 HISTORY — DX: Headache, unspecified: R51.9

## 2016-06-30 HISTORY — DX: Dizziness and giddiness: R42

## 2016-06-30 HISTORY — DX: Other specified postprocedural states: R11.2

## 2016-06-30 HISTORY — DX: Other specified postprocedural states: Z98.890

## 2016-06-30 HISTORY — DX: Unspecified osteoarthritis, unspecified site: M19.90

## 2016-06-30 HISTORY — DX: Headache: R51

## 2016-06-30 HISTORY — DX: Hypothyroidism, unspecified: E03.9

## 2016-06-30 SURGERY — COLONOSCOPY WITH PROPOFOL
Anesthesia: Monitor Anesthesia Care | Wound class: Contaminated

## 2016-06-30 MED ORDER — LIDOCAINE HCL (CARDIAC) 20 MG/ML IV SOLN
INTRAVENOUS | Status: DC | PRN
  Administered 2016-06-30: 50 mg via INTRAVENOUS

## 2016-06-30 MED ORDER — ACETAMINOPHEN 325 MG PO TABS: 325.0000 mg | ORAL_TABLET | ORAL | Status: DC | PRN

## 2016-06-30 MED ORDER — ACETAMINOPHEN 160 MG/5ML PO SOLN: 325.0000 mg | ORAL | Status: DC | PRN

## 2016-06-30 MED ORDER — PROPOFOL 10 MG/ML IV BOLUS
INTRAVENOUS | Status: DC | PRN
  Administered 2016-06-30: 20 mg via INTRAVENOUS
  Administered 2016-06-30: 30 mg via INTRAVENOUS
  Administered 2016-06-30: 100 mg via INTRAVENOUS
  Administered 2016-06-30: 50 mg via INTRAVENOUS
  Administered 2016-06-30: 20 mg via INTRAVENOUS

## 2016-06-30 MED ORDER — LACTATED RINGERS IV SOLN
INTRAVENOUS | Status: DC
  Administered 2016-06-30: 08:00:00 via INTRAVENOUS

## 2016-06-30 SURGICAL SUPPLY — 23 items
CANISTER SUCT 1200ML W/VALVE (MISCELLANEOUS) ×3 IMPLANT
CLIP HMST 235XBRD CATH ROT (MISCELLANEOUS) IMPLANT
CLIP RESOLUTION 360 11X235 (MISCELLANEOUS)
FCP ESCP3.2XJMB 240X2.8X (MISCELLANEOUS)
FORCEPS BIOP RAD 4 LRG CAP 4 (CUTTING FORCEPS) IMPLANT
FORCEPS BIOP RJ4 240 W/NDL (MISCELLANEOUS)
FORCEPS ESCP3.2XJMB 240X2.8X (MISCELLANEOUS) IMPLANT
GOWN CVR UNV OPN BCK APRN NK (MISCELLANEOUS) ×4 IMPLANT
GOWN ISOL THUMB LOOP REG UNIV (MISCELLANEOUS) ×2
INJECTOR VARIJECT VIN23 (MISCELLANEOUS) IMPLANT
KIT DEFENDO VALVE AND CONN (KITS) IMPLANT
KIT ENDO PROCEDURE OLY (KITS) ×3 IMPLANT
MARKER SPOT ENDO TATTOO 5ML (MISCELLANEOUS) IMPLANT
PAD GROUND ADULT SPLIT (MISCELLANEOUS) IMPLANT
PROBE APC STR FIRE (PROBE) IMPLANT
RETRIEVER NET ROTH 2.5X230 LF (MISCELLANEOUS) IMPLANT
SNARE SHORT THROW 13M SML OVAL (MISCELLANEOUS) IMPLANT
SNARE SHORT THROW 30M LRG OVAL (MISCELLANEOUS) IMPLANT
SNARE SNG USE RND 15MM (INSTRUMENTS) IMPLANT
SPOT EX ENDOSCOPIC TATTOO (MISCELLANEOUS)
TRAP ETRAP POLY (MISCELLANEOUS) IMPLANT
VARIJECT INJECTOR VIN23 (MISCELLANEOUS)
WATER STERILE IRR 250ML POUR (IV SOLUTION) ×3 IMPLANT

## 2016-06-30 NOTE — Anesthesia Preprocedure Evaluation (Signed)
Anesthesia Evaluation  Patient identified by MRN, date of birth, ID band Patient awake    Reviewed: Allergy & Precautions, H&P , NPO status , Patient's Chart, lab work & pertinent test results, reviewed documented beta blocker date and time   History of Anesthesia Complications (+) PONV and history of anesthetic complications  Airway Mallampati: III  TM Distance: >3 FB Neck ROM: full    Dental no notable dental hx.    Pulmonary Current Smoker,    Pulmonary exam normal breath sounds clear to auscultation       Cardiovascular Exercise Tolerance: Good negative cardio ROS Normal cardiovascular exam Rhythm:regular Rate:Normal     Neuro/Psych  Headaches, PSYCHIATRIC DISORDERS Depression   GI/Hepatic negative GI ROS, Neg liver ROS,   Endo/Other  Hypothyroidism   Renal/GU negative Renal ROS  negative genitourinary   Musculoskeletal   Abdominal   Peds  Hematology negative hematology ROS (+)   Anesthesia Other Findings   Reproductive/Obstetrics negative OB ROS                             Anesthesia Physical Anesthesia Plan  ASA: II  Anesthesia Plan: MAC   Post-op Pain Management:    Induction:   Airway Management Planned:   Additional Equipment:   Intra-op Plan:   Post-operative Plan:   Informed Consent: I have reviewed the patients History and Physical, chart, labs and discussed the procedure including the risks, benefits and alternatives for the proposed anesthesia with the patient or authorized representative who has indicated his/her understanding and acceptance.   Dental Advisory Given  Plan Discussed with: CRNA  Anesthesia Plan Comments:         Anesthesia Quick Evaluation

## 2016-06-30 NOTE — Anesthesia Postprocedure Evaluation (Signed)
Anesthesia Post Note  Patient: Mary Miller  Procedure(s) Performed: Procedure(s) (LRB): COLONOSCOPY WITH PROPOFOL (N/A) POLYPECTOMY INTESTINAL  Patient location during evaluation: PACU Anesthesia Type: MAC Level of consciousness: awake and alert Pain management: pain level controlled Vital Signs Assessment: post-procedure vital signs reviewed and stable Respiratory status: spontaneous breathing, nonlabored ventilation, respiratory function stable and patient connected to nasal cannula oxygen Cardiovascular status: stable and blood pressure returned to baseline Anesthetic complications: no    Trecia Rogers

## 2016-06-30 NOTE — Transfer of Care (Signed)
Immediate Anesthesia Transfer of Care Note  Patient: Mary Miller  Procedure(s) Performed: Procedure(s) with comments: COLONOSCOPY WITH PROPOFOL (N/A) POLYPECTOMY INTESTINAL - Transverse colon polyp x 2.  Patient Location: PACU  Anesthesia Type: MAC  Level of Consciousness: awake, alert  and patient cooperative  Airway and Oxygen Therapy: Patient Spontanous Breathing and Patient connected to supplemental oxygen  Post-op Assessment: Post-op Vital signs reviewed, Patient's Cardiovascular Status Stable, Respiratory Function Stable, Patent Airway and No signs of Nausea or vomiting  Post-op Vital Signs: Reviewed and stable  Complications: No apparent anesthesia complications

## 2016-06-30 NOTE — Anesthesia Procedure Notes (Signed)
Procedure Name: MAC Date/Time: 06/30/2016 7:55 AM Performed by: Cameron Ali Pre-anesthesia Checklist: Patient identified, Emergency Drugs available, Suction available, Timeout performed and Patient being monitored Patient Re-evaluated:Patient Re-evaluated prior to inductionOxygen Delivery Method: Nasal cannula Placement Confirmation: positive ETCO2

## 2016-06-30 NOTE — H&P (Signed)
  Lucilla Lame, MD [address] [place], [place] [address] Phone: [phone] Fax : [phone]  Primary Care Physician:  Trinna Post, PA-C Primary Gastroenterologist:  Dr. Allen Norris  Pre-Procedure History & Physical: HPI:  Mary Miller is a 51 y.o. female is here for a screening colonoscopy.   Past Medical History:  Diagnosis Date  . Arthritis    hands  . Headache    2 or 3/week  . Hypothyroidism   . PONV (postoperative nausea and vomiting)   . Vertigo    dizzyness/ comes and goes    Past Surgical History:  Procedure Laterality Date  . CESAREAN SECTION     X's 3  . HYSTERECTOMY ABDOMINAL WITH SALPINGECTOMY    . WRIST SURGERY Left    Removed a cyst    Prior to Admission medications   Medication Sig Start Date End Date Taking? Authorizing Provider  clonazePAM (KLONOPIN) 0.5 MG tablet Take 1 tablet (0.5 mg total) by mouth 2 (two) times daily as needed for anxiety. 06/02/16  Yes Trinna Post, PA-C  FLUoxetine (PROZAC) 20 MG tablet Take 1 tablet (20 mg total) by mouth daily. Patient taking differently: Take 20 mg by mouth daily. am 06/02/16  Yes Trinna Post, PA-C  levothyroxine (SYNTHROID, LEVOTHROID) 75 MCG tablet Take 1 tablet (75 mcg total) by mouth daily. Patient taking differently: Take 75 mcg by mouth daily before breakfast.  04/20/16  Yes Trinna Post, PA-C    Allergies as of 06/03/2016  . (No Known Allergies)    Family History  Problem Relation Age of Onset  . Hypertension Mother   . Hypertension Father     Social History   Social History  . Marital status: Married    Spouse name: N/A  . Number of children: N/A  . Years of education: N/A   Occupational History  . Not on file.   Social History Main Topics  . Smoking status: Current Every Day Smoker    Packs/day: 0.25    Years: 25.00    Types: Cigarettes  . Smokeless tobacco: Never Used  . Alcohol use 1.2 oz/week    2 Shots of liquor per week     Comment: Socially    . Drug use: No  . Sexual activity: Not on file   Other Topics Concern  . Not on file   Social History Narrative  . No narrative on file    Review of Systems: See HPI, otherwise negative ROS  Physical Exam: BP 118/82   Pulse 64   Temp 97.3 F (36.3 C) (Tympanic)   Resp 16   Ht 5\' 2"  (1.575 m)   Wt 157 lb (71.2 kg)   SpO2 97%   BMI 28.72 kg/m  General:   Alert,  pleasant and cooperative in NAD Head:  Normocephalic and atraumatic. Neck:  Supple; no masses or thyromegaly. Lungs:  Clear throughout to auscultation.    Heart:  Regular rate and rhythm. Abdomen:  Soft, nontender and nondistended. Normal bowel sounds, without guarding, and without rebound.   Neurologic:  Alert and  oriented x4;  grossly normal neurologically.  Impression/Plan: Mary Miller is now here to undergo a screening colonoscopy.  Risks, benefits, and alternatives regarding colonoscopy have been reviewed with the patient.  Questions have been answered.  All parties agreeable.

## 2016-06-30 NOTE — Op Note (Signed)
[facility] Gastroenterology Patient Name: Mary Miller Procedure Date: 06/30/2016 7:39 AM MRN: [id number] Account #: [id number] Date of Birth: 1966-05-07 Admit Type: Outpatient Age: 51 Room: [facility] OR ROOM 01 Gender: Female Note Status: Finalized Procedure:            Colonoscopy Indications:          Screening for colorectal malignant neoplasm Providers:            Lucilla Lame MD, MD Referring MD:         Janine Ores. Rosanna Randy, MD (Referring MD) Medicines:            Propofol per Anesthesia Complications:        No immediate complications. Procedure:            Pre-Anesthesia Assessment:                       - Prior to the procedure, a History and Physical was                        performed, and patient medications and allergies were                        reviewed. The patient's tolerance of previous                        anesthesia was also reviewed. The risks and benefits of                        the procedure and the sedation options and risks were                        discussed with the patient. All questions were                        answered, and informed consent was obtained. Prior                        Anticoagulants: The patient has taken no previous                        anticoagulant or antiplatelet agents. ASA Grade                        Assessment: II - A patient with mild systemic disease.                        After reviewing the risks and benefits, the patient was                        deemed in satisfactory condition to undergo the                        procedure.                       After obtaining informed consent, the colonoscope was                        passed under direct vision. Throughout the procedure,  the patient's blood pressure, pulse, and oxygen                        saturations were monitored continuously. The [place] [id number]) was introduced through  the                        anus and advanced to the the cecum, identified by                        appendiceal orifice and ileocecal valve. The                        colonoscopy was performed without difficulty. The                        patient tolerated the procedure well. The quality of                        the bowel preparation was excellent. Findings:      The perianal and digital rectal examinations were normal.      Two sessile polyps were found in the transverse colon. The polyps were 3       to 4 mm in size. These polyps were removed with a cold snare. Resection       and retrieval were complete.      A 6 mm polyp was found in the sigmoid colon. The polyp was sessile. The       polyp was removed with a cold snare. Resection and retrieval were       complete.      Non-bleeding internal hemorrhoids were found during retroflexion. The       hemorrhoids were Grade I (internal hemorrhoids that do not prolapse). Impression:           - Two 3 to 4 mm polyps in the transverse colon, removed                        with a cold snare. Resected and retrieved.                       - One 6 mm polyp in the sigmoid colon, removed with a                        cold snare. Resected and retrieved.                       - Non-bleeding internal hemorrhoids. Recommendation:       - Discharge patient to home.                       - Resume previous diet.                       - Continue present medications.                       - Await pathology results.                       -  Repeat colonoscopy in 5 years if polyp adenoma and 10                        years if hyperplastic Procedure Code(s):    --- Professional ---                       [phone], Colonoscopy, flexible; with removal of tumor(s),                        polyp(s), or other lesion(s) by snare technique Diagnosis Code(s):    --- Professional ---                       Z12.11, Encounter for screening for malignant neoplasm                         of colon                       D12.3, Benign neoplasm of transverse colon (hepatic                        flexure or splenic flexure)                       D12.5, Benign neoplasm of sigmoid colon CPT copyright 2016 American Medical Association. All rights reserved. The codes documented in this report are preliminary and upon coder review may  be revised to meet current compliance requirements. Lucilla Lame MD, MD 06/30/2016 8:15:05 AM This report has been signed electronically. Number of Addenda: 0 Note Initiated On: 06/30/2016 7:39 AM Scope Withdrawal Time: 0 hours 8 minutes 5 seconds  Total Procedure Duration: 0 hours 9 minutes 41 seconds       [facility]

## 2016-07-01 ENCOUNTER — Encounter: Payer: Self-pay | Admitting: Gastroenterology

## 2016-07-03 ENCOUNTER — Encounter: Payer: Self-pay | Admitting: Gastroenterology

## 2016-07-07 ENCOUNTER — Encounter: Payer: Self-pay | Admitting: Physician Assistant

## 2016-07-07 ENCOUNTER — Ambulatory Visit (INDEPENDENT_AMBULATORY_CARE_PROVIDER_SITE_OTHER): Payer: 59 | Admitting: Physician Assistant

## 2016-07-07 VITALS — BP 120/72 | HR 80 | Temp 98.7°F | Resp 16 | Wt 160.0 lb

## 2016-07-07 DIAGNOSIS — F329 Major depressive disorder, single episode, unspecified: Secondary | ICD-10-CM | POA: Diagnosis not present

## 2016-07-07 DIAGNOSIS — F32A Depression, unspecified: Secondary | ICD-10-CM

## 2016-07-07 DIAGNOSIS — F419 Anxiety disorder, unspecified: Secondary | ICD-10-CM

## 2016-07-07 MED ORDER — FLUOXETINE HCL 40 MG PO CAPS: 40.0000 mg | ORAL_CAPSULE | Freq: Every day | ORAL | 0 refills | Status: DC

## 2016-07-07 NOTE — Patient Instructions (Signed)

## 2016-07-07 NOTE — Progress Notes (Signed)
Patient: Mary Miller Female    DOB: 1965/05/31   51 y.o.   MRN: [id number] Visit Date: 07/07/2016  Today's Provider: Trinna Post, PA-C   Chief Complaint  Patient presents with  . Anxiety    4 week follow up    Subjective:    HPI Patient comes in today for a 4 week follow up on Anxiety. On last OV, she was started on Prozac 20mg  daily. Patient reports that she has noticed a difference in her mood since starting the medication. However, patient reports that she still has trouble sleeping. She can fall asleep, but sometimes she wakes up. She is able to fall back asleep however, unlike before. The patient reports feeling a little dizzy at times. She does have a history of benign paroxysmal positional vertigo. She has about 6-7 pills of the Klonopin left. She reports her son is still looking for a psychiatrist. Since the last time she was here, she has gotten her colonoscopy with Dr. Allen Norris. There were some benign polyps and internal hemorrhoids but otherwise normal.     No Known Allergies   Current Outpatient Prescriptions:  .  clonazePAM (KLONOPIN) 0.5 MG tablet, Take 1 tablet (0.5 mg total) by mouth 2 (two) times daily as needed for anxiety., Disp: 30 tablet, Rfl: 0 .  levothyroxine (SYNTHROID, LEVOTHROID) 75 MCG tablet, Take 1 tablet (75 mcg total) by mouth daily. (Patient taking differently: Take 75 mcg by mouth daily before breakfast. ), Disp: 90 tablet, Rfl: 3 .  FLUoxetine (PROZAC) 40 MG capsule, Take 1 capsule (40 mg total) by mouth daily., Disp: 90 capsule, Rfl: 0  Review of Systems  Constitutional: Positive for fatigue. Negative for activity change, appetite change, chills, diaphoresis, fever and unexpected weight change.  Neurological: Negative.   Psychiatric/Behavioral: Negative for agitation, behavioral problems, confusion, decreased concentration, dysphoric mood and self-injury. The patient is not nervous/anxious and is not hyperactive.     Social History    Substance Use Topics  . Smoking status: Current Every Day Smoker    Packs/day: 0.25    Years: 25.00    Types: Cigarettes  . Smokeless tobacco: Never Used  . Alcohol use 1.2 oz/week    2 Shots of liquor per week     Comment: Socially   Objective:   BP 120/72   Pulse 80   Temp 98.7 F (37.1 C)   Resp 16   Wt 160 lb (72.6 kg)   BMI 29.26 kg/m   Physical Exam  Constitutional: She appears well-developed and well-nourished.  Skin: Skin is warm.  Psychiatric: She has a normal mood and affect. Her behavior is normal. Judgment and thought content normal.        Assessment & Plan:     1. Depression, unspecified depression type  Increased to 40 mg daily for better control of sleep cycle. Patient to call back with issues.  - FLUoxetine (PROZAC) 40 MG capsule; Take 1 capsule (40 mg total) by mouth daily.  Dispense: 90 capsule; Refill: 0  2. Anxiety  See above. Doing well on prozac and Klonopin.   Return in about 6 months (around 01/04/2017) for thyroid, depression.  Patient Instructions  Major Depressive Disorder, Adult Major depressive disorder (MDD) is a mental health condition. MDD often makes you feel sad, hopeless, or helpless. MDD can also cause symptoms in your body. MDD can affect your:  Work.  School.  Relationships.  Other normal activities. MDD can range from mild to  very bad. It may occur once (single episode MDD). It can also occur many times (recurrent MDD). The main symptoms of MDD often include:  Feeling sad, depressed, or irritable most of the time.  Loss of interest. MDD symptoms also include:  Sleeping too much or too little.  Eating too much or too little.  A change in your weight.  Feeling tired (fatigue) or having low energy.  Feeling worthless.  Feeling guilty.  Trouble making decisions.  Trouble thinking clearly.  Thoughts of suicide or harming others.  Feeling weak.  Feeling agitated.  Keeping yourself from being around  other people (isolation). Follow these instructions at home: Activity  Do these things as told by your doctor:  Go back to your normal activities.  Exercise regularly.  Spend time outdoors. Alcohol  Talk with your doctor about how alcohol can affect your antidepressant medicines.  Do not drink alcohol. Or, limit how much alcohol you drink.  This means no more than 1 drink a day for nonpregnant women and 2 drinks a day for men. One drink equals one of these:  12 oz of beer.  5 oz of wine.  1 oz of hard liquor. General instructions  Take over-the-counter and prescription medicines only as told by your doctor.  Eat a healthy diet.  Get plenty of sleep.  Find activities that you enjoy. Make time to do them.  Think about joining a support group. Your doctor may be able to suggest a group for you.  Keep all follow-up visits as told by your doctor. This is important. Where to find more information:  [employer] on Mental Illness:  www.nami.org  U.S. National Institute of Mental Health:  https://carter.com/  National Suicide Prevention Lifeline:  614-320-5028. This is free, 24-hour help. Contact a doctor if:  Your symptoms get worse.  You have new symptoms. Get help right away if:  You self-harm.  You see, hear, taste, smell, or feel things that are not present (hallucinate). If you ever feel like you may hurt yourself or others, or have thoughts about taking your own life, get help right away. You can go to your nearest emergency department or call:  Your local emergency services (911 in the [place]  A suicide crisis helpline, such as the [place]:  803-510-1390. This is open 24 hours a day. This information is not intended to replace advice given to you by your health care provider. Make sure you discuss any questions you have with your health care provider. Document Released: 04/20/2015 Document Revised: 01/24/2016  Document Reviewed: 01/24/2016 Elsevier Interactive Patient Education  2017 [employer].    The entirety of the information documented in the History of Present Illness, Review of Systems and Physical Exam were personally obtained by me. Portions of this information were initially documented by [facility] and reviewed by me for thoroughness and accuracy.             Trinna Post, PA-C  [facility]

## 2016-10-21 ENCOUNTER — Other Ambulatory Visit: Payer: Self-pay | Admitting: Physician Assistant

## 2016-10-21 DIAGNOSIS — F329 Major depressive disorder, single episode, unspecified: Secondary | ICD-10-CM

## 2016-10-21 DIAGNOSIS — F32A Depression, unspecified: Secondary | ICD-10-CM

## 2016-10-25 NOTE — Telephone Encounter (Signed)
LOV 10/21/2016 for depression, and has FU on 01/04/2017. Renaldo Fiddler, CMA

## 2017-01-04 ENCOUNTER — Ambulatory Visit: Payer: 59 | Admitting: Physician Assistant

## 2017-01-13 ENCOUNTER — Ambulatory Visit (INDEPENDENT_AMBULATORY_CARE_PROVIDER_SITE_OTHER): Payer: 59 | Admitting: Physician Assistant

## 2017-01-13 ENCOUNTER — Encounter: Payer: Self-pay | Admitting: Physician Assistant

## 2017-01-13 VITALS — BP 122/86 | HR 71 | Temp 98.1°F | Wt 158.8 lb

## 2017-01-13 DIAGNOSIS — F329 Major depressive disorder, single episode, unspecified: Secondary | ICD-10-CM | POA: Diagnosis not present

## 2017-01-13 DIAGNOSIS — F419 Anxiety disorder, unspecified: Secondary | ICD-10-CM

## 2017-01-13 DIAGNOSIS — G4700 Insomnia, unspecified: Secondary | ICD-10-CM | POA: Diagnosis not present

## 2017-01-13 DIAGNOSIS — F32A Depression, unspecified: Secondary | ICD-10-CM

## 2017-01-13 MED ORDER — TRAZODONE HCL 50 MG PO TABS: 25.0000 mg | ORAL_TABLET | Freq: Every evening | ORAL | 3 refills | Status: DC | PRN

## 2017-01-13 MED ORDER — FLUOXETINE HCL 40 MG PO CAPS: 40.0000 mg | ORAL_CAPSULE | Freq: Every day | ORAL | 0 refills | Status: DC

## 2017-01-13 NOTE — Progress Notes (Signed)
Patient: Mary Miller Female    DOB: [birth year]   51 y.o.   MRN: [id number] Visit Date: 01/13/2017  Today's Provider: Trinna Post, PA-C   Chief Complaint  Patient presents with  . Depression  . Hypothyroidism  . Follow-up   Subjective:    HPI Depression/Anxiety Follow up:  Patient is here for a 6 month follow up. Last time she was seen for this issue was on 07/07/2016. At that time Prozac was increased to 40 mg. Patient advised to continue Clonazepam 0.5 mg. She reports good compliance with treatment plan. Symptoms are better, but she is still experiencing sleep disturbance.  No longer talking Klonopin. Has had diarrhea with melatonin in the past.   Hypothyroid, follow-up:  TSH  Date Value Ref Range Status  04/20/2016 24.840 (H) 0.450 - 4.500 uIU/mL Final   Wt Readings from Last 3 Encounters:  01/13/17 158 lb 12.8 oz (72 kg)  07/07/16 160 lb (72.6 kg)  06/30/16 157 lb (71.2 kg)    She was last seen for hypothyroid on 04/20/2016 Management since that visit includes continue medication. She reports good compliance with treatment. She is not having side effects.  She is exercising. She is experiencing none at this time She denies change in energy level, diarrhea, heat / cold intolerance, nervousness and weight changes Weight trend: stable  ------------------------------------------------------------------------    Previous Medications   LEVOTHYROXINE (SYNTHROID, LEVOTHROID) 75 MCG TABLET    Take 1 tablet (75 mcg total) by mouth daily.    Review of Systems  Constitutional: Negative.   Respiratory: Negative.   Cardiovascular: Negative.   Endocrine: Negative.   Psychiatric/Behavioral: Positive for sleep disturbance.    Social History  Substance Use Topics  . Smoking status: Current Every Day Smoker    Packs/day: 0.25    Years: 25.00    Types: Cigarettes  . Smokeless tobacco: Never Used  . Alcohol use 1.2 oz/week    2 Shots of liquor per week   Comment: Socially   Objective:   BP 122/86 (BP Location: Left Arm, Patient Position: Sitting, Cuff Size: Normal)   Pulse 71   Temp 98.1 F (36.7 C) (Oral)   Wt 158 lb 12.8 oz (72 kg)   SpO2 96%   BMI 29.04 kg/m   Physical Exam  Constitutional: She is oriented to person, place, and time. She appears well-developed and well-nourished.  Cardiovascular: Normal rate.   Pulmonary/Chest: Effort normal.  Neurological: She is alert and oriented to person, place, and time.  Skin: Skin is warm and dry.  Psychiatric: She has a normal mood and affect. Her behavior is normal.        Assessment & Plan:     1. Insomnia, unspecified type  Will try 1/2 - 1 tab of trazodone at night. Counseled on sedation risk. See her back 6 mo for physical. Reprinted lab reqs.  - traZODone (DESYREL) 50 MG tablet; Take 0.5-1 tablets (25-50 mg total) by mouth at bedtime as needed for sleep.  Dispense: 30 tablet; Refill: 3  2. Anxiety  Stable on 40 mg prozac. Refilled today.  3. Depression, unspecified depression type  - FLUoxetine (PROZAC) 40 MG capsule; Take 1 capsule (40 mg total) by mouth daily.  Dispense: 90 capsule; Refill: 0  The entirety of the information documented in the History of Present Illness, Review of Systems and Physical Exam were personally obtained by me. Portions of this information were initially documented by [facility] and reviewed by me for thoroughness and accuracy.  Follow up: Return in about 6 months (around 07/16/2017) for CPE.

## 2017-01-13 NOTE — Patient Instructions (Signed)

## 2017-05-13 ENCOUNTER — Other Ambulatory Visit: Payer: Self-pay | Admitting: Physician Assistant

## 2017-05-13 DIAGNOSIS — G4700 Insomnia, unspecified: Secondary | ICD-10-CM

## 2017-05-13 DIAGNOSIS — E039 Hypothyroidism, unspecified: Secondary | ICD-10-CM

## 2017-05-15 NOTE — Telephone Encounter (Signed)
For Adriana.   Thanks,   -Mickel Baas

## 2017-07-18 ENCOUNTER — Other Ambulatory Visit: Payer: Self-pay | Admitting: Physician Assistant

## 2017-07-18 ENCOUNTER — Encounter: Payer: 59 | Admitting: Physician Assistant

## 2017-07-18 DIAGNOSIS — E039 Hypothyroidism, unspecified: Secondary | ICD-10-CM

## 2017-07-18 NOTE — Telephone Encounter (Signed)
[facility] faxed refill request for the following medications:  levothyroxine (SYNTHROID, LEVOTHROID) 75 MCG tablet   90 day supply  Last Rx: 05/15/17  LOV: 01/13/17 NOV: 07/24/17 (CPE) Please advise. Thanks TNP

## 2017-07-19 MED ORDER — LEVOTHYROXINE SODIUM 75 MCG PO TABS: 75.0000 ug | ORAL_TABLET | Freq: Every day | ORAL | 0 refills | Status: DC

## 2017-07-24 ENCOUNTER — Ambulatory Visit (INDEPENDENT_AMBULATORY_CARE_PROVIDER_SITE_OTHER): Payer: 59 | Admitting: Physician Assistant

## 2017-07-24 ENCOUNTER — Encounter: Payer: Self-pay | Admitting: Physician Assistant

## 2017-07-24 VITALS — BP 132/86 | HR 88 | Temp 98.4°F | Resp 16 | Ht 62.0 in | Wt 161.0 lb

## 2017-07-24 DIAGNOSIS — Z131 Encounter for screening for diabetes mellitus: Secondary | ICD-10-CM

## 2017-07-24 DIAGNOSIS — Z720 Tobacco use: Secondary | ICD-10-CM | POA: Diagnosis not present

## 2017-07-24 DIAGNOSIS — Z130 Encounter for screening for diseases of the blood and blood-forming organs and certain disorders involving the immune mechanism: Secondary | ICD-10-CM

## 2017-07-24 DIAGNOSIS — E782 Mixed hyperlipidemia: Secondary | ICD-10-CM | POA: Diagnosis not present

## 2017-07-24 DIAGNOSIS — F329 Major depressive disorder, single episode, unspecified: Secondary | ICD-10-CM | POA: Diagnosis not present

## 2017-07-24 DIAGNOSIS — F32A Depression, unspecified: Secondary | ICD-10-CM

## 2017-07-24 DIAGNOSIS — G4700 Insomnia, unspecified: Secondary | ICD-10-CM | POA: Diagnosis not present

## 2017-07-24 DIAGNOSIS — Z114 Encounter for screening for human immunodeficiency virus [HIV]: Secondary | ICD-10-CM | POA: Diagnosis not present

## 2017-07-24 DIAGNOSIS — F419 Anxiety disorder, unspecified: Secondary | ICD-10-CM | POA: Diagnosis not present

## 2017-07-24 DIAGNOSIS — Z1239 Encounter for other screening for malignant neoplasm of breast: Secondary | ICD-10-CM

## 2017-07-24 DIAGNOSIS — Z0000 Encounter for general adult medical examination without abnormal findings: Secondary | ICD-10-CM

## 2017-07-24 DIAGNOSIS — Z1231 Encounter for screening mammogram for malignant neoplasm of breast: Secondary | ICD-10-CM

## 2017-07-24 DIAGNOSIS — E039 Hypothyroidism, unspecified: Secondary | ICD-10-CM | POA: Diagnosis not present

## 2017-07-24 MED ORDER — FLUOXETINE HCL 20 MG PO TABS: ORAL_TABLET | ORAL | 1 refills | Status: DC

## 2017-07-24 MED ORDER — TRAZODONE HCL 50 MG PO TABS: 25.0000 mg | ORAL_TABLET | Freq: Every evening | ORAL | 1 refills | Status: DC | PRN

## 2017-07-24 NOTE — Progress Notes (Signed)
Patient: Mary Miller, Female    DOB: 21-Dec-1965, 52 y.o.   MRN: [id number] Visit Date: 07/24/2017  Today's Provider: Trinna Post, PA-C   Chief Complaint  Patient presents with  . Annual Exam   Subjective:    Annual physical exam Mary Miller is a 52 y.o. female who presents today for health maintenance and complete physical. She feels fairly well. She reports exercising some.  She tries to walk some.  She reports she is sleeping poorly.  She has a history of hypothyroidism and is on 75 mcg of Synthroid. Her last TSH in 03/2016 was 24. She does report fatigue and weight gain.  She does continue to smoke. She uses alcohol.   She has not had mammogram yet, needs to be scheduled. She had a colonoscopy last year which was normal. She has had a hysterectomy remotely for noncancerous reason and does not have a cervix.  She has a history of anxiety and depression, previously on 40 mg Prozac with good success. She reports she has run out of this prescription because her [facility] changed to [facility] and she didn't know how to set it up. The same situation occurred with her trazodone, which was also providing good relief for her insomnia.  Has had many life events recently: daughter is pregnant and due in one month. Son's fiance was involved in MVC.  -----------------------------------------------------------------   Review of Systems  Constitutional: Positive for diaphoresis and fatigue.  HENT: Positive for sinus pressure.   Eyes: Negative.   Respiratory: Negative.   Cardiovascular: Negative.   Gastrointestinal: Negative.   Endocrine: Negative.   Genitourinary: Negative.   Musculoskeletal: Positive for back pain.  Skin: Negative.   Allergic/Immunologic: Negative.   Neurological: Negative.   Hematological: Negative.   Psychiatric/Behavioral: Negative.     Social History      She  reports that she has been smoking cigarettes.  She has a 6.25 pack-year  smoking history. she has never used smokeless tobacco. She reports that she drinks about 1.2 oz of alcohol per week. She reports that she does not use drugs.       Social History   Socioeconomic History  . Marital status: Married    Spouse name: None  . Number of children: None  . Years of education: None  . Highest education level: None  Social Needs  . Financial resource strain: None  . Food insecurity - worry: None  . Food insecurity - inability: None  . Transportation needs - medical: None  . Transportation needs - non-medical: None  Occupational History  . None  Tobacco Use  . Smoking status: Current Every Day Smoker    Packs/day: 0.25    Years: 25.00    Pack years: 6.25    Types: Cigarettes  . Smokeless tobacco: Never Used  Substance and Sexual Activity  . Alcohol use: Yes    Alcohol/week: 1.2 oz    Types: 2 Shots of liquor per week    Comment: Socially  . Drug use: No  . Sexual activity: None  Other Topics Concern  . None  Social History Narrative  . None    Past Medical History:  Diagnosis Date  . Arthritis    hands  . Headache    2 or 3/week  . Hypothyroidism   . PONV (postoperative nausea and vomiting)   . Vertigo    dizzyness/ comes and goes     Patient Active Problem List  Diagnosis Date Noted  . Special screening for malignant neoplasms, colon   . Benign neoplasm of transverse colon   . Polyp of sigmoid colon   . Carpal tunnel syndrome 04/20/2016  . Clinical depression 04/20/2016  . Borderline diabetes 04/20/2016  . Insomnia 11/17/2014  . Allergic rhinitis 09/02/2009  . Anxiety 10/09/2008  . Adult hypothyroidism 08/07/2008  . 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 Current tobacco use 07/01/2008  . Acid reflux 07/01/2008    Past Surgical History:  Procedure Laterality Date  . CESAREAN SECTION     X's 3  . COLONOSCOPY WITH PROPOFOL N/A 06/30/2016   Procedure: COLONOSCOPY WITH PROPOFOL;  Surgeon: Lucilla Lame,  MD;  Location: [facility];  Service: Endoscopy;  Laterality: N/A;  . HYSTERECTOMY ABDOMINAL WITH SALPINGECTOMY    . POLYPECTOMY  06/30/2016   Procedure: POLYPECTOMY INTESTINAL;  Surgeon: Lucilla Lame, MD;  Location: [facility];  Service: Endoscopy;;  Transverse colon polyp x 2.  . WRIST SURGERY Left    Removed a cyst    Family History        Family Status  Relation Name Status  . Mother  Alive  . Father  Alive        Her family history includes Hypertension in her father and mother.      No Known Allergies   Current Outpatient Medications:  .  levothyroxine (SYNTHROID, LEVOTHROID) 75 MCG tablet, Take 1 tablet (75 mcg total) by mouth daily., Disp: 90 tablet, Rfl: 0 .  FLUoxetine (PROZAC) 40 MG capsule, Take 1 capsule (40 mg total) by mouth daily., Disp: 90 capsule, Rfl: 0 .  traZODone (DESYREL) 50 MG tablet, TAKE 0.5-1 TABLETS (25-50 MG TOTAL) BY MOUTH AT BEDTIME AS NEEDED FOR SLEEP. (Patient not taking: Reported on 07/24/2017), Disp: 30 tablet, Rfl: 2   Patient Care Team: Paulene Floor as PCP - General (Physician Assistant)      Objective:   Vitals: BP 132/86 (BP Location: Right Arm, Patient Position: Sitting, Cuff Size: Normal)   Pulse 88   Temp 98.4 F (36.9 C) (Oral)   Resp 16   Ht 5\' 2"  (1.575 m)   Wt 161 lb (73 kg)   BMI 29.45 kg/m    Vitals:   07/24/17 1330  BP: 132/86  Pulse: 88  Resp: 16  Temp: 98.4 F (36.9 C)  TempSrc: Oral  Weight: 161 lb (73 kg)  Height: 5\' 2"  (1.575 m)     Physical Exam  Constitutional: She is oriented to person, place, and time. She appears well-developed and well-nourished.  HENT:  Right Ear: External ear normal.  Left Ear: External ear normal.  Eyes: Conjunctivae are normal.  Neck: Neck supple.  Cardiovascular: Normal rate and regular rhythm.  Pulmonary/Chest: Effort normal and breath sounds normal. Right breast exhibits no inverted nipple, no mass, no nipple discharge, no skin change and no  tenderness. Left breast exhibits no inverted nipple, no mass, no nipple discharge, no skin change and no tenderness. Breasts are symmetrical.  Abdominal: Soft. Bowel sounds are normal.  Neurological: She is alert and oriented to person, place, and time.  Skin: Skin is warm and dry.  Psychiatric: She has a normal mood and affect. Her behavior is normal.     Depression Screen PHQ 2/9 Scores 07/24/2017 01/13/2017 06/02/2016  PHQ - 2 Score 1 1 0  PHQ- 9 Score 5 8 -      Assessment & Plan:     Routine Health Maintenance  and Physical Exam  Exercise Activities and Dietary recommendations Goals    None      Immunization History  Administered Date(s) Administered  . Tdap 01/19/2011    Health Maintenance  Topic Date Due  . HIV Screening  11/05/1979  . MAMMOGRAM  11/05/2014  . INFLUENZA VACCINE  12/21/2017 (Originally 12/21/2016)  . TETANUS/TDAP  01/18/2021  . COLONOSCOPY  06/30/2026     Discussed health benefits of physical activity, and encouraged her to engage in regular exercise appropriate for her age and condition.   1. Annual physical exam  Get labwork today. Bring food log to next visit.   2. Adult hypothyroidism  Labwork, adjust synthroid accordingly.   - TSH  3. Current tobacco use   4. Combined fat and carbohydrate induced hyperlipemia  - Lipid Profile  5. Depression, unspecified depression type  Restart at 20 mg daily x 1 month. After one month, take two tablets daily to return to former dose. Follow up in two months.  - FLUoxetine (PROZAC) 20 MG tablet; Take 1 tablet daily for one month. On second month, take two tablets daily  Dispense: 90 tablet; Refill: 1  6. Anxiety  - FLUoxetine (PROZAC) 20 MG tablet; Take 1 tablet daily for one month. On second month, take two tablets daily  Dispense: 90 tablet; Refill: 1  7. Encounter for screening for HIV  - HIV antibody (with reflex)  8. Screening for deficiency anemia  - CBC with Differential  9.  Diabetes mellitus screening  - Comprehensive Metabolic Panel (CMET)  10. Insomnia, unspecified type  - traZODone (DESYREL) 50 MG tablet; Take 0.5-1 tablets (25-50 mg total) by mouth at bedtime as needed for sleep.  Dispense: 90 tablet; Refill: 1  11. Breast cancer screening  Please schedule mammogram.   - MM Digital Screening; Future  Return in about 2 months (around 09/23/2017) for weight loss, depression.  The entirety of the information documented in the History of Present Illness, Review of Systems and Physical Exam were personally obtained by me. Portions of this information were initially documented by Ashley Royalty, CMA and reviewed by me for thoroughness and accuracy.   --------------------------------------------------------------------    Trinna Post, PA-C  [facility]

## 2017-07-25 ENCOUNTER — Telehealth: Payer: Self-pay

## 2017-07-25 ENCOUNTER — Other Ambulatory Visit: Payer: Self-pay | Admitting: Physician Assistant

## 2017-07-25 DIAGNOSIS — E039 Hypothyroidism, unspecified: Secondary | ICD-10-CM

## 2017-07-25 LAB — COMPREHENSIVE METABOLIC PANEL
ALT: 30 IU/L (ref 0–32)
AST: 26 IU/L (ref 0–40)
Albumin/Globulin Ratio: 1.7 (ref 1.2–2.2)
Albumin: 4.5 g/dL (ref 3.5–5.5)
Alkaline Phosphatase: 94 IU/L (ref 39–117)
BUN/Creatinine Ratio: 17 (ref 9–23)
BUN: 13 mg/dL (ref 6–24)
Bilirubin Total: 0.2 mg/dL (ref 0.0–1.2)
CO2: 22 mmol/L (ref 20–29)
Calcium: 9.3 mg/dL (ref 8.7–10.2)
Chloride: 104 mmol/L (ref 96–106)
Creatinine, Ser: 0.75 mg/dL (ref 0.57–1.00)
GFR calc Af Amer: 106 mL/min/{1.73_m2} (ref >59)
GFR calc non Af Amer: 92 mL/min/{1.73_m2} (ref >59)
Globulin, Total: 2.7 g/dL (ref 1.5–4.5)
Glucose: 102 mg/dL — ABNORMAL HIGH (ref 65–99)
Potassium: 4.3 mmol/L (ref 3.5–5.2)
Sodium: 141 mmol/L (ref 134–144)
Total Protein: 7.2 g/dL (ref 6.0–8.5)

## 2017-07-25 LAB — HIV ANTIBODY (ROUTINE TESTING W REFLEX): HIV Screen 4th Generation wRfx: NONREACTIVE

## 2017-07-25 LAB — CBC WITH DIFFERENTIAL/PLATELET
Basophils Absolute: 0.1 10*3/uL (ref 0.0–0.2)
Basos: 1 %
EOS (ABSOLUTE): 0.2 10*3/uL (ref 0.0–0.4)
Eos: 2 %
Hematocrit: 42 % (ref 34.0–46.6)
Hemoglobin: 14.2 g/dL (ref 11.1–15.9)
Immature Grans (Abs): 0 10*3/uL (ref 0.0–0.1)
Immature Granulocytes: 0 %
Lymphocytes Absolute: 2.5 10*3/uL (ref 0.7–3.1)
Lymphs: 25 %
MCH: 31.5 pg (ref 26.6–33.0)
MCHC: 33.8 g/dL (ref 31.5–35.7)
MCV: 93 fL (ref 79–97)
Monocytes Absolute: 0.7 10*3/uL (ref 0.1–0.9)
Monocytes: 7 %
Neutrophils Absolute: 6.7 10*3/uL (ref 1.4–7.0)
Neutrophils: 65 %
Platelets: 364 10*3/uL (ref 150–379)
RBC: 4.51 x10E6/uL (ref 3.77–5.28)
RDW: 13.7 % (ref 12.3–15.4)
WBC: 10.2 10*3/uL (ref 3.4–10.8)

## 2017-07-25 LAB — LIPID PANEL
Chol/HDL Ratio: 6.8 ratio — ABNORMAL HIGH (ref 0.0–4.4)
Cholesterol, Total: 264 mg/dL — ABNORMAL HIGH (ref 100–199)
HDL: 39 mg/dL — ABNORMAL LOW (ref >39)
Triglycerides: 549 mg/dL — ABNORMAL HIGH (ref 0–149)

## 2017-07-25 LAB — TSH: TSH: 9.25 u[IU]/mL — ABNORMAL HIGH (ref 0.450–4.500)

## 2017-07-25 MED ORDER — LEVOTHYROXINE SODIUM 88 MCG PO TABS: 88.0000 ug | ORAL_TABLET | Freq: Every day | ORAL | 2 refills | Status: DC

## 2017-07-25 NOTE — Telephone Encounter (Signed)
-----   Message from Trinna Post, [place] sent at 07/25/2017  9:28 AM EST ----- TSH still shows uncontrolled hypothyroidism. Will send in next highest dose of levothyroxine which is 88 mcg daily [facility] for her to start taking and we can recheck at her follow up. Her cholesterol is elevated, can we please add direct LDL under dx hyperlipidemia if pt agreeable due to high cholesterol. CMET normal, HIV normal.

## 2017-07-25 NOTE — Progress Notes (Signed)
Synthroid 88 mcg [facility].

## 2017-07-25 NOTE — Telephone Encounter (Signed)
Test added  Thanks,   -Mary Miller  

## 2017-07-27 ENCOUNTER — Telehealth: Payer: Self-pay

## 2017-07-27 NOTE — Telephone Encounter (Signed)
LMTCB 07/27/2017  Thanks,   -Mickel Baas

## 2017-07-27 NOTE — Telephone Encounter (Signed)
-----   Message from Trinna Post, [place] sent at 07/25/2017  9:28 AM EST ----- TSH still shows uncontrolled hypothyroidism. Will send in next highest dose of levothyroxine which is 88 mcg daily [facility] for her to start taking and we can recheck at her follow up. Her cholesterol is elevated, can we please add direct LDL under dx hyperlipidemia if pt agreeable due to high cholesterol. CMET normal, HIV normal.

## 2017-07-27 NOTE — Telephone Encounter (Signed)
Pt advised.   Thanks,   -Laura  

## 2017-07-27 NOTE — Telephone Encounter (Signed)
Notes recorded by Trinna Post, PA-C on 07/27/2017 at 9:52 AM EST LDL is high but her cholesterol does not qualify for treatment yet. Would focus on reducing saturated fats like butter, red meat, cheese, etc. And increasing exercise. ------

## 2017-07-28 ENCOUNTER — Other Ambulatory Visit: Payer: Self-pay | Admitting: Physician Assistant

## 2017-07-28 DIAGNOSIS — E039 Hypothyroidism, unspecified: Secondary | ICD-10-CM

## 2017-07-28 LAB — SPECIMEN STATUS REPORT

## 2017-07-28 LAB — LDL CHOLESTEROL, DIRECT: LDL Direct: 147 mg/dL — ABNORMAL HIGH (ref 0–99)

## 2017-07-28 MED ORDER — LEVOTHYROXINE SODIUM 88 MCG PO TABS: 88.0000 ug | ORAL_TABLET | Freq: Every day | ORAL | 0 refills | Status: DC

## 2017-07-28 NOTE — Telephone Encounter (Signed)
Sent. I got a form from [place] saying we need to confirm some information and resubmit form electronically. Have left this form with Mickel Baas to get additional info.

## 2017-07-28 NOTE — Telephone Encounter (Signed)
Pt requesting a 1 week supply of Levothyroxine 88 MCG sent to [facility] [place].  She only has one pill left and is afraid she will run out.  She hasn't received it from the [facility] yet.

## 2017-07-31 MED ORDER — LEVOTHYROXINE SODIUM 88 MCG PO TABS: 88.0000 ug | ORAL_TABLET | Freq: Every day | ORAL | 1 refills | Status: DC

## 2017-07-31 NOTE — Telephone Encounter (Signed)
Resent synthroid with requested info to [facility].

## 2017-07-31 NOTE — Telephone Encounter (Signed)
Pt's Member ID : [id number].  The form is back on your desk.   Thanks,   -Mickel Baas

## 2017-07-31 NOTE — Addendum Note (Signed)
Addended by: Trinna Post on: 07/31/2017 03:00 PM   Modules accepted: Orders

## 2017-08-01 ENCOUNTER — Other Ambulatory Visit: Payer: Self-pay | Admitting: Physician Assistant

## 2017-08-01 DIAGNOSIS — F32A Depression, unspecified: Secondary | ICD-10-CM

## 2017-08-01 DIAGNOSIS — F329 Major depressive disorder, single episode, unspecified: Secondary | ICD-10-CM

## 2017-08-01 DIAGNOSIS — E039 Hypothyroidism, unspecified: Secondary | ICD-10-CM

## 2017-08-01 DIAGNOSIS — G4700 Insomnia, unspecified: Secondary | ICD-10-CM

## 2017-08-01 DIAGNOSIS — F419 Anxiety disorder, unspecified: Secondary | ICD-10-CM

## 2017-08-01 MED ORDER — FLUOXETINE HCL 20 MG PO TABS: ORAL_TABLET | ORAL | 1 refills | Status: DC

## 2017-08-01 MED ORDER — LEVOTHYROXINE SODIUM 88 MCG PO TABS: 88.0000 ug | ORAL_TABLET | Freq: Every day | ORAL | 1 refills | Status: DC

## 2017-08-01 MED ORDER — TRAZODONE HCL 50 MG PO TABS: 25.0000 mg | ORAL_TABLET | Freq: Every evening | ORAL | 1 refills | Status: DC | PRN

## 2017-08-01 NOTE — Telephone Encounter (Signed)
Pt requesting Fluoxetine 20 MG, Levothroxine 88MCG and Trazodone 50 MG be called into [facility] in [place].  Pt states her insurance company said as of January she has to use [employer].

## 2017-09-25 ENCOUNTER — Encounter: Payer: Self-pay | Admitting: Physician Assistant

## 2017-09-25 ENCOUNTER — Ambulatory Visit (INDEPENDENT_AMBULATORY_CARE_PROVIDER_SITE_OTHER): Payer: 59 | Admitting: Physician Assistant

## 2017-09-25 VITALS — BP 122/86 | HR 76 | Temp 98.5°F | Resp 16 | Wt 158.0 lb

## 2017-09-25 DIAGNOSIS — F419 Anxiety disorder, unspecified: Secondary | ICD-10-CM

## 2017-09-25 DIAGNOSIS — F329 Major depressive disorder, single episode, unspecified: Secondary | ICD-10-CM | POA: Diagnosis not present

## 2017-09-25 DIAGNOSIS — E039 Hypothyroidism, unspecified: Secondary | ICD-10-CM | POA: Diagnosis not present

## 2017-09-25 DIAGNOSIS — F32A Depression, unspecified: Secondary | ICD-10-CM

## 2017-09-25 MED ORDER — FLUOXETINE HCL 40 MG PO CAPS: 40.0000 mg | ORAL_CAPSULE | Freq: Every day | ORAL | 3 refills | Status: DC

## 2017-09-25 NOTE — Progress Notes (Signed)
Patient: Mary Miller Female    DOB: 07-07-1965   52 y.o.   MRN: [id number] Visit Date: 09/25/2017  Today's Provider: Trinna Post, PA-C   Chief Complaint  Patient presents with  . Depression    Two month follow up  . Anxiety  . Hypothyroidism   Subjective:    Mary Miller is presenting today for follow up of below. Had run out of medications and Prozac was restarted at 20 mg. TSH was checked and elevated, SYntrhoid increased to 88 mcg. She is tolerating these well. 3 lb weight loss since last visit. Continues to smoke. Going through tough times with her son right now which is contributing to stress.   Depression         This is a chronic problem.  The problem has been gradually improving since onset.  Associated symptoms include fatigue.  Associated symptoms include no decreased concentration, does not have insomnia, no restlessness, no appetite change, no headaches and no suicidal ideas.  Past treatments include SSRIs - Selective serotonin reuptake inhibitors.  Compliance with treatment is good.  Previous treatment provided moderate relief.  Risk factors include stress.   Past medical history includes thyroid problem and anxiety.   Anxiety  Presents for follow-up visit. Symptoms include nervous/anxious behavior. Patient reports no chest pain, compulsions, confusion, decreased concentration, depressed mood, dizziness, dry mouth, excessive worry, feeling of choking, hyperventilation, impotence, insomnia, irritability, malaise, muscle tension, nausea, obsessions, palpitations, panic, restlessness, shortness of breath or suicidal ideas. The quality of sleep is good.    Thyroid Problem  Presents for follow-up visit. Symptoms include anxiety and fatigue. Patient reports no cold intolerance, depressed mood, diaphoresis, hair loss, heat intolerance, hoarse voice, nail problem, palpitations, visual change, weight gain or weight loss. The symptoms have been stable.   No Known  Allergies   Current Outpatient Medications:  .  levothyroxine (SYNTHROID, LEVOTHROID) 88 MCG tablet, Take 1 tablet (88 mcg total) by mouth daily., Disp: 90 tablet, Rfl: 1 .  traZODone (DESYREL) 50 MG tablet, Take 0.5-1 tablets (25-50 mg total) by mouth at bedtime as needed for sleep., Disp: 90 tablet, Rfl: 1 .  FLUoxetine (PROZAC) 40 MG capsule, Take 1 capsule (40 mg total) by mouth daily., Disp: 90 capsule, Rfl: 3  Review of Systems  Constitutional: Positive for fatigue. Negative for activity change, appetite change, chills, diaphoresis, fever, irritability, unexpected weight change, weight gain and weight loss.  HENT: Negative for hoarse voice.   Respiratory: Negative.  Negative for shortness of breath.   Cardiovascular: Negative.  Negative for chest pain and palpitations.  Gastrointestinal: Negative.  Negative for nausea.  Endocrine: Negative.  Negative for cold intolerance and heat intolerance.  Genitourinary: Negative for impotence.  Neurological: Negative for dizziness, light-headedness and headaches.  Psychiatric/Behavioral: Positive for depression. Negative for agitation, behavioral problems, confusion, decreased concentration, dysphoric mood, hallucinations, self-injury, sleep disturbance and suicidal ideas. The patient is nervous/anxious. The patient does not have insomnia and is not hyperactive.     Social History   Tobacco Use  . Smoking status: Current Every Day Smoker    Packs/day: 0.25    Years: 25.00    Pack years: 6.25    Types: Cigarettes  . Smokeless tobacco: Never Used  Substance Use Topics  . Alcohol use: Yes    Alcohol/week: 1.2 oz    Types: 2 Shots of liquor per week    Comment: Socially   Objective:   BP 122/86 (BP Location: Right  Arm, Patient Position: Sitting, Cuff Size: Normal)   Pulse 76   Temp 98.5 F (36.9 C) (Oral)   Resp 16   Wt 158 lb (71.7 kg)   BMI 28.90 kg/m  Vitals:   09/25/17 1306  BP: 122/86  Pulse: 76  Resp: 16  Temp: 98.5 F  (36.9 C)  TempSrc: Oral  Weight: 158 lb (71.7 kg)     Physical Exam  Constitutional: She is oriented to person, place, and time. She appears well-developed and well-nourished.  Cardiovascular: Normal rate and regular rhythm.  Pulmonary/Chest: Effort normal and breath sounds normal.  Neurological: She is alert and oriented to person, place, and time.  Skin: Skin is warm and dry.  Psychiatric: She has a normal mood and affect. Her behavior is normal.        Assessment & Plan:     1. Adult hypothyroidism  Repeat TSH and adjust as necessary.   - TSH  2. Anxiety  Increased to 40 mg daily. Offered counseling or support group, patient reports she feels supported by her friends and family but will consider this in the future.   - FLUoxetine (PROZAC) 40 MG capsule; Take 1 capsule (40 mg total) by mouth daily.  Dispense: 90 capsule; Refill: 3  3. Depression, unspecified depression type  - FLUoxetine (PROZAC) 40 MG capsule; Take 1 capsule (40 mg total) by mouth daily.  Dispense: 90 capsule; Refill: 3  Return in about 1 month (around 10/26/2017) for depression and thyroid .  The entirety of the information documented in the History of Present Illness, Review of Systems and Physical Exam were personally obtained by me. Portions of this information were initially documented by Ashley Royalty, CMA and reviewed by me for thoroughness and accuracy.        Trinna Post, PA-C  [facility]

## 2017-09-25 NOTE — Patient Instructions (Signed)
Hypothyroidism Hypothyroidism is a disorder of the thyroid. The thyroid is a large gland that is located in the lower front of the neck. The thyroid releases hormones that control how the body works. With hypothyroidism, the thyroid does not make enough of these hormones. What are the causes? Causes of hypothyroidism may include:  Viral infections.  Pregnancy.  Your own defense system (immune system) attacking your thyroid.  Certain medicines.  Birth defects.  Past radiation treatments to your head or neck.  Past treatment with radioactive iodine.  Past surgical removal of part or all of your thyroid.  Problems with the gland that is located in the center of your brain (pituitary).  What are the signs or symptoms? Signs and symptoms of hypothyroidism may include:  Feeling as though you have no energy (lethargy).  Inability to tolerate cold.  Weight gain that is not explained by a change in diet or exercise habits.  Dry skin.  Coarse hair.  Menstrual irregularity.  Slowing of thought processes.  Constipation.  Sadness or depression.  How is this diagnosed? Your health care provider may diagnose hypothyroidism with blood tests and ultrasound tests. How is this treated? Hypothyroidism is treated with medicine that replaces the hormones that your body does not make. After you begin treatment, it may take several weeks for symptoms to go away. Follow these instructions at home:  Take medicines only as directed by your health care provider.  If you start taking any new medicines, tell your health care provider.  Keep all follow-up visits as directed by your health care provider. This is important. As your condition improves, your dosage needs may change. You will need to have blood tests regularly so that your health care provider can watch your condition. Contact a health care provider if:  Your symptoms do not get better with treatment.  You are taking thyroid  replacement medicine and: ? You sweat excessively. ? You have tremors. ? You feel anxious. ? You lose weight rapidly. ? You cannot tolerate heat. ? You have emotional swings. ? You have diarrhea. ? You feel weak. Get help right away if:  You develop chest pain.  You develop an irregular heartbeat.  You develop a rapid heartbeat. This information is not intended to replace advice given to you by your health care provider. Make sure you discuss any questions you have with your health care provider. Document Released: 05/09/2005 Document Revised: 10/15/2015 Document Reviewed: 09/24/2013 Elsevier Interactive Patient Education  2018 [employer]vier Inc[employer]  

## 2017-09-26 ENCOUNTER — Telehealth: Payer: Self-pay

## 2017-09-26 LAB — TSH: TSH: 1.8 u[IU]/mL (ref 0.450–4.500)

## 2017-09-26 NOTE — Telephone Encounter (Signed)
-----   Message from Trinna Post, [place] sent at 09/26/2017  9:41 AM EDT ----- TSH has come into normal range. Please continue this current dose of thyroid replacement.

## 2017-09-26 NOTE — Telephone Encounter (Signed)
Advised patient of results.  

## 2017-09-26 NOTE — Telephone Encounter (Signed)
Left message to call back  

## 2017-10-26 ENCOUNTER — Ambulatory Visit: Payer: Self-pay | Admitting: Physician Assistant

## 2018-02-01 ENCOUNTER — Other Ambulatory Visit: Payer: Self-pay | Admitting: Physician Assistant

## 2018-02-01 DIAGNOSIS — F419 Anxiety disorder, unspecified: Secondary | ICD-10-CM

## 2018-02-01 DIAGNOSIS — F32A Depression, unspecified: Secondary | ICD-10-CM

## 2018-02-01 DIAGNOSIS — E039 Hypothyroidism, unspecified: Secondary | ICD-10-CM

## 2018-02-01 DIAGNOSIS — G4700 Insomnia, unspecified: Secondary | ICD-10-CM

## 2018-02-01 DIAGNOSIS — F329 Major depressive disorder, single episode, unspecified: Secondary | ICD-10-CM

## 2018-02-01 MED ORDER — LEVOTHYROXINE SODIUM 88 MCG PO TABS: 88.0000 ug | ORAL_TABLET | Freq: Every day | ORAL | 1 refills | Status: DC

## 2018-02-01 MED ORDER — FLUOXETINE HCL 40 MG PO CAPS: 40.0000 mg | ORAL_CAPSULE | Freq: Every day | ORAL | 1 refills | Status: DC

## 2018-02-01 MED ORDER — TRAZODONE HCL 50 MG PO TABS: 25.0000 mg | ORAL_TABLET | Freq: Every evening | ORAL | 2 refills | Status: DC | PRN

## 2018-02-01 NOTE — Telephone Encounter (Signed)
Needs new refill on her  Trazodone 50 mg  Fluoxetine 40 mg  Levothyroxine 88 mcg  She uses [facility]  [place]  Pt CB#  [phone]  Thanks  Con Memos

## 2018-03-08 ENCOUNTER — Ambulatory Visit
Admission: RE | Admit: 2018-03-08 | Discharge: 2018-03-08 | Disposition: A | Discharge status: Home / Self Care | Payer: 59 | Source: Ambulatory Visit | Attending: Physician Assistant | Admitting: Physician Assistant

## 2018-03-08 ENCOUNTER — Encounter: Payer: Self-pay | Admitting: Physician Assistant

## 2018-03-08 ENCOUNTER — Ambulatory Visit: Payer: 59 | Admitting: Physician Assistant

## 2018-03-08 ENCOUNTER — Telehealth: Payer: Self-pay

## 2018-03-08 VITALS — BP 146/96 | HR 80 | Temp 98.2°F | Resp 16 | Wt 158.2 lb

## 2018-03-08 DIAGNOSIS — F419 Anxiety disorder, unspecified: Secondary | ICD-10-CM | POA: Diagnosis not present

## 2018-03-08 DIAGNOSIS — R0781 Pleurodynia: Secondary | ICD-10-CM

## 2018-03-08 DIAGNOSIS — I700 Atherosclerosis of aorta: Secondary | ICD-10-CM | POA: Insufficient documentation

## 2018-03-08 DIAGNOSIS — R079 Chest pain, unspecified: Secondary | ICD-10-CM | POA: Diagnosis not present

## 2018-03-08 DIAGNOSIS — F17200 Nicotine dependence, unspecified, uncomplicated: Secondary | ICD-10-CM | POA: Insufficient documentation

## 2018-03-08 DIAGNOSIS — W19XXXA Unspecified fall, initial encounter: Secondary | ICD-10-CM

## 2018-03-08 DIAGNOSIS — Z23 Encounter for immunization: Secondary | ICD-10-CM

## 2018-03-08 DIAGNOSIS — E039 Hypothyroidism, unspecified: Secondary | ICD-10-CM

## 2018-03-08 MED ORDER — HYDROCODONE-ACETAMINOPHEN 5-325 MG PO TABS: ORAL_TABLET | ORAL | 0 refills | Status: DC

## 2018-03-08 NOTE — Telephone Encounter (Signed)
Patient advised as below.  

## 2018-03-08 NOTE — Telephone Encounter (Signed)
-----   Message from Trinna Post, [place] sent at 03/08/2018 10:47 AM EDT ----- Some chronic changes from smoking, otherwise no fracture on xray. Take pain medication to help with breathing as needed. See if this makes her drowsy before taking any trazadone to sleep.

## 2018-03-08 NOTE — Telephone Encounter (Signed)
lmtcb

## 2018-03-08 NOTE — Progress Notes (Signed)
f      Patient: Mary Miller Female    DOB: November 05, 1965   52 y.o.   MRN: [id number] Visit Date: 03/08/2018  Today's Provider: Trinna Post, PA-C   Chief Complaint  Patient presents with  . Follow-up  . Fall  . Anxiety  . Depression  . Hypothyroidism   Subjective:    HPI  Patient reports she fell over a week a go, tripped over a chair and landed on her right side. Patient reports pain around her right breast, and reports pain is worse with deep breaths and with cough. She denies fevers, chills, SOB. She has been using pillow to splint when she coughs which feels better.    Follow up for hypothyroidism  The patient was last seen for this 5 months ago. Changes made at last visit include check labs.  She reports excellent compliance with treatment. She feels that condition is Unchanged. She is not having side effects.   ------------------------------------------------------------------------------------   Follow up for anxiety/depression  The patient was last seen for this 5 months ago. Changes made at last visit include increase fluoxetine to 40 mg daily.  She reports excellent compliance with treatment. She feels that condition is Unchanged. She is not having side effects.   Reports her children are going well.  ------------------------------------------------------------------------------------  Depression screen [facility] 2/9 03/08/2018 07/24/2017 01/13/2017 06/02/2016  Decreased Interest 0 0 0 0  Down, Depressed, Hopeless 0 1 1 0  PHQ - 2 Score 0 1 1 0  Altered sleeping 2 2 3  -  Tired, decreased energy 0 2 2 -  Change in appetite 0 0 2 -  Feeling bad or failure about yourself  0 0 0 -  Trouble concentrating 0 0 0 -  Moving slowly or fidgety/restless 0 0 0 -  Suicidal thoughts 0 0 0 -  PHQ-9 Score 2 5 8  -  Difficult doing work/chores Not difficult at all Very difficult Not difficult at all -   GAD 7 : Generalized Anxiety Score 03/08/2018 06/02/2016  Nervous,  Anxious, on Edge 0 3  Control/stop worrying 0 3  Worry too much - different things 0 3  Trouble relaxing 2 3  Restless 0 1  Easily annoyed or irritable 0 3  Afraid - awful might happen 0 1  Total GAD 7 Score 2 17  Anxiety Difficulty Somewhat difficult Very difficult      No Known Allergies   Current Outpatient Medications:  .  FLUoxetine (PROZAC) 40 MG capsule, Take 1 capsule (40 mg total) by mouth daily., Disp: 90 capsule, Rfl: 1 .  levothyroxine (SYNTHROID, LEVOTHROID) 88 MCG tablet, Take 1 tablet (88 mcg total) by mouth daily., Disp: 90 tablet, Rfl: 1 .  traZODone (DESYREL) 50 MG tablet, Take 0.5-1 tablets (25-50 mg total) by mouth at bedtime as needed for sleep., Disp: 90 tablet, Rfl: 2  Review of Systems  Constitutional: Negative.   Respiratory: Negative.   Cardiovascular: Negative.   Musculoskeletal: Positive for myalgias.  Psychiatric/Behavioral: Positive for agitation and sleep disturbance. The patient is nervous/anxious.     Social History   Tobacco Use  . Smoking status: Current Every Day Smoker    Packs/day: 0.25    Years: 25.00    Pack years: 6.25    Types: Cigarettes  . Smokeless tobacco: Never Used  Substance Use Topics  . Alcohol use: Yes    Alcohol/week: 2.0 standard drinks    Types: 2 Shots of liquor per week    Comment:  Socially   Objective:   BP (!) 146/96 (BP Location: Left Arm, Patient Position: Sitting, Cuff Size: Normal)   Pulse 80   Temp 98.2 F (36.8 C) (Oral)   Resp 16   Wt 158 lb 3.2 oz (71.8 kg)   SpO2 97%   BMI 28.94 kg/m  Vitals:   03/08/18 0817  BP: (!) 146/96  Pulse: 80  Resp: 16  Temp: 98.2 F (36.8 C)  TempSrc: Oral  SpO2: 97%  Weight: 158 lb 3.2 oz (71.8 kg)     Physical Exam  Constitutional: She is oriented to person, place, and time. She appears well-developed and well-nourished.  Tearful in office when trying to breathe   Cardiovascular: Normal rate and regular rhythm.  Pulmonary/Chest: Effort normal and  breath sounds normal.    Neurological: She is alert and oriented to person, place, and time.  Skin: Skin is warm and dry.        Assessment & Plan:     1. Anxiety  Continue Paxil 40 mg.   2. Need for influenza vaccination  - Flu Vaccine QUAD 36+ mos IM  3. Adult hypothyroidism  Stable on last TSH. Recheck TSH at physical/f/u. Continue Synthroid 88 mcg daily.  4. Fall, initial encounter  She has fallen against a chair in the past week. Has severe right sided rib pain constantly that worsens when breathing. Will get CXR to assess for broken ribs and lungs but will try to control pain as below. Taking paxil and do not want interaction with tramadol. Reviewed sedation precautions, advised on risk of constipation and to take Miralax.   - HYDROcodone-acetaminophen (NORCO/VICODIN) 5-325 MG tablet; Take 1/2 tablet every 6 hours as needed for pain.  Dispense: 20 tablet; Refill: 0  5. Rib pain on right side  - DG Chest 2 View; Future - HYDROcodone-acetaminophen (NORCO/VICODIN) 5-325 MG tablet; Take 1/2 tablet every 6 hours as needed for pain.  Dispense: 20 tablet; Refill: 0  Return in about 6 months (around 09/07/2018) for CPE and f/u.  The entirety of the information documented in the History of Present Illness, Review of Systems and Physical Exam were personally obtained by me. Portions of this information were initially documented by Lynford Humphrey, CMA and reviewed by me for thoroughness and accuracy.        Trinna Post, PA-C  [facility]

## 2018-03-08 NOTE — Telephone Encounter (Signed)
Pt returned missed call.  Please call pt back. ° °Thanks, °[facility] °

## 2018-08-03 ENCOUNTER — Other Ambulatory Visit: Payer: Self-pay | Admitting: Family Medicine

## 2018-08-03 DIAGNOSIS — E039 Hypothyroidism, unspecified: Secondary | ICD-10-CM

## 2018-09-06 ENCOUNTER — Encounter: Payer: 59 | Admitting: Physician Assistant

## 2018-10-21 ENCOUNTER — Other Ambulatory Visit: Payer: Self-pay | Admitting: Family Medicine

## 2018-10-21 DIAGNOSIS — G4700 Insomnia, unspecified: Secondary | ICD-10-CM

## 2018-10-29 ENCOUNTER — Encounter: Payer: 59 | Admitting: Physician Assistant

## 2018-10-31 ENCOUNTER — Other Ambulatory Visit: Payer: Self-pay | Admitting: Physician Assistant

## 2018-10-31 DIAGNOSIS — F419 Anxiety disorder, unspecified: Secondary | ICD-10-CM

## 2018-10-31 DIAGNOSIS — F329 Major depressive disorder, single episode, unspecified: Secondary | ICD-10-CM

## 2018-10-31 DIAGNOSIS — F32A Depression, unspecified: Secondary | ICD-10-CM

## 2019-01-13 ENCOUNTER — Other Ambulatory Visit: Payer: Self-pay | Admitting: Family Medicine

## 2019-01-13 ENCOUNTER — Other Ambulatory Visit: Payer: Self-pay | Admitting: Physician Assistant

## 2019-01-13 DIAGNOSIS — F419 Anxiety disorder, unspecified: Secondary | ICD-10-CM

## 2019-01-13 DIAGNOSIS — F32A Depression, unspecified: Secondary | ICD-10-CM

## 2019-01-13 DIAGNOSIS — E039 Hypothyroidism, unspecified: Secondary | ICD-10-CM

## 2019-01-13 DIAGNOSIS — G4700 Insomnia, unspecified: Secondary | ICD-10-CM

## 2019-01-13 DIAGNOSIS — F329 Major depressive disorder, single episode, unspecified: Secondary | ICD-10-CM

## 2019-01-30 ENCOUNTER — Other Ambulatory Visit: Payer: Self-pay | Admitting: Physician Assistant

## 2019-01-30 ENCOUNTER — Other Ambulatory Visit: Payer: Self-pay

## 2019-01-30 ENCOUNTER — Ambulatory Visit (INDEPENDENT_AMBULATORY_CARE_PROVIDER_SITE_OTHER): Payer: 59 | Admitting: Physician Assistant

## 2019-01-30 ENCOUNTER — Encounter: Payer: Self-pay | Admitting: Physician Assistant

## 2019-01-30 VITALS — BP 150/92 | HR 72 | Temp 97.5°F | Resp 16 | Ht 62.0 in | Wt 155.0 lb

## 2019-01-30 DIAGNOSIS — F419 Anxiety disorder, unspecified: Secondary | ICD-10-CM

## 2019-01-30 DIAGNOSIS — R739 Hyperglycemia, unspecified: Secondary | ICD-10-CM

## 2019-01-30 DIAGNOSIS — Z0000 Encounter for general adult medical examination without abnormal findings: Secondary | ICD-10-CM

## 2019-01-30 DIAGNOSIS — G5603 Carpal tunnel syndrome, bilateral upper limbs: Secondary | ICD-10-CM | POA: Diagnosis not present

## 2019-01-30 DIAGNOSIS — Z23 Encounter for immunization: Secondary | ICD-10-CM | POA: Diagnosis not present

## 2019-01-30 DIAGNOSIS — E039 Hypothyroidism, unspecified: Secondary | ICD-10-CM

## 2019-01-30 DIAGNOSIS — E7800 Pure hypercholesterolemia, unspecified: Secondary | ICD-10-CM

## 2019-01-30 DIAGNOSIS — F329 Major depressive disorder, single episode, unspecified: Secondary | ICD-10-CM

## 2019-01-30 DIAGNOSIS — Z1239 Encounter for other screening for malignant neoplasm of breast: Secondary | ICD-10-CM

## 2019-01-30 DIAGNOSIS — Z720 Tobacco use: Secondary | ICD-10-CM

## 2019-01-30 DIAGNOSIS — F32A Depression, unspecified: Secondary | ICD-10-CM

## 2019-01-30 MED ORDER — HYDROXYZINE HCL 10 MG PO TABS: ORAL_TABLET | ORAL | 0 refills | Status: DC

## 2019-01-30 NOTE — Patient Instructions (Signed)
Call insurance provider about shingles    Health Maintenance, Female Adopting a healthy lifestyle and getting preventive care are important in promoting health and wellness. Ask your health care provider about:  The right schedule for you to have regular tests and exams.  Things you can do on your own to prevent diseases and keep yourself healthy. What should I know about diet, weight, and exercise? Eat a healthy diet   Eat a diet that includes plenty of vegetables, fruits, low-fat dairy products, and lean protein.  Do not eat a lot of foods that are high in solid fats, added sugars, or sodium. Maintain a healthy weight Body mass index (BMI) is used to identify weight problems. It estimates body fat based on height and weight. Your health care provider can help determine your BMI and help you achieve or maintain a healthy weight. Get regular exercise Get regular exercise. This is one of the most important things you can do for your health. Most adults should:  Exercise for at least 150 minutes each week. The exercise should increase your heart rate and make you sweat (moderate-intensity exercise).  Do strengthening exercises at least twice a week. This is in addition to the moderate-intensity exercise.  Spend less time sitting. Even light physical activity can be beneficial. Watch cholesterol and blood lipids Have your blood tested for lipids and cholesterol at 54 years of age, then have this test every 5 years. Have your cholesterol levels checked more often if:  Your lipid or cholesterol levels are high.  You are older than 54 years of age.  You are at high risk for heart disease. What should I know about cancer screening? Depending on your health history and family history, you may need to have cancer screening at various ages. This may include screening for:  Breast cancer.  Cervical cancer.  Colorectal cancer.  Skin cancer.  Lung cancer. What should I know about  heart disease, diabetes, and high blood pressure? Blood pressure and heart disease  High blood pressure causes heart disease and increases the risk of stroke. This is more likely to develop in people who have high blood pressure readings, are of African descent, or are overweight.  Have your blood pressure checked: ? Every 3-5 years if you are 36-60 years of age. ? Every year if you are 17 years old or older. Diabetes Have regular diabetes screenings. This checks your fasting blood sugar level. Have the screening done:  Once every three years after age 40 if you are at a normal weight and have a low risk for diabetes.  More often and at a younger age if you are overweight or have a high risk for diabetes. What should I know about preventing infection? Hepatitis B If you have a higher risk for hepatitis B, you should be screened for this virus. Talk with your health care provider to find out if you are at risk for hepatitis B infection. Hepatitis C Testing is recommended for:  Everyone born from [birth year] through 1965.  Anyone with known risk factors for hepatitis C. Sexually transmitted infections (STIs)  Get screened for STIs, including gonorrhea and chlamydia, if: ? You are sexually active and are younger than 54 years of age. ? You are older than 54 years of age and your health care provider tells you that you are at risk for this type of infection. ? Your sexual activity has changed since you were last screened, and you are at increased risk for chlamydia  or gonorrhea. Ask your health care provider if you are at risk.  Ask your health care provider about whether you are at high risk for HIV. Your health care provider may recommend a prescription medicine to help prevent HIV infection. If you choose to take medicine to prevent HIV, you should first get tested for HIV. You should then be tested every 3 months for as long as you are taking the medicine. Pregnancy  If you are about to  stop having your period (premenopausal) and you may become pregnant, seek counseling before you get pregnant.  Take 400 to 800 micrograms (mcg) of folic acid every day if you become pregnant.  Ask for birth control (contraception) if you want to prevent pregnancy. Osteoporosis and menopause Osteoporosis is a disease in which the bones lose minerals and strength with aging. This can result in bone fractures. If you are 28 years old or older, or if you are at risk for osteoporosis and fractures, ask your health care provider if you should:  Be screened for bone loss.  Take a calcium or vitamin D supplement to lower your risk of fractures.  Be given hormone replacement therapy (HRT) to treat symptoms of menopause. Follow these instructions at home: Lifestyle  Do not use any products that contain nicotine or tobacco, such as cigarettes, e-cigarettes, and chewing tobacco. If you need help quitting, ask your health care provider.  Do not use street drugs.  Do not share needles.  Ask your health care provider for help if you need support or information about quitting drugs. Alcohol use  Do not drink alcohol if: ? Your health care provider tells you not to drink. ? You are pregnant, may be pregnant, or are planning to become pregnant.  If you drink alcohol: ? Limit how much you use to 0-1 drink a day. ? Limit intake if you are breastfeeding.  Be aware of how much alcohol is in your drink. In the [place] one drink equals one 12 oz bottle of beer (355 mL), one 5 oz glass of wine (148 mL), or one 1 oz glass of hard liquor (44 mL). General instructions  Schedule regular health, dental, and eye exams.  Stay current with your vaccines.  Tell your health care provider if: ? You often feel depressed. ? You have ever been abused or do not feel safe at home. Summary  Adopting a healthy lifestyle and getting preventive care are important in promoting health and wellness.  Follow your  health care provider's instructions about healthy diet, exercising, and getting tested or screened for diseases.  Follow your health care provider's instructions on monitoring your cholesterol and blood pressure. This information is not intended to replace advice given to you by your health care provider. Make sure you discuss any questions you have with your health care provider. Document Released: 11/22/2010 Document Revised: 05/02/2018 Document Reviewed: 05/02/2018 Elsevier Patient Education  2020 [employer].

## 2019-01-30 NOTE — Progress Notes (Signed)
Patient: Mary Miller, Female    DOB: 08/31/1965, 53 y.o.   MRN: [id number] Visit Date: 01/30/2019  Today's Provider: Trinna Post, PA-C   Chief Complaint  Patient presents with  . Annual Exam   Subjective:     Annual physical exam Mary Miller is a 53 y.o. female who presents today for health maintenance and complete physical. She feels well. She reports exercising yes/walking. She reports she is sleeping poorly. Presents today for physical and follow up.   PAP: Hysterectomy Mammogram: Due, ordered.  Colonoscopy: 2018 Influenza: Due today Tdap: 01/19/2011  Hypothyroidism: Currently taking synthroid 88 mcg every day without issue.   Lab Results  Component Value Date   TSH 2.370 01/30/2019   Prediabetes  Lab Results  Component Value Date   HGBA1C 5.8 (H) 01/30/2019   HLD  Lipid Panel     Component Value Date/Time   CHOL 294 (H) 01/30/2019 1506   TRIG 450 (H) 01/30/2019 1506   HDL 43 01/30/2019 1506   CHOLHDL 6.8 (H) 01/30/2019 1506   [place] Comment 07/24/2017 1415   LDLDIRECT 176 (H) 01/30/2019 1506   The 10-year ASCVD risk score Mikey Bussing DC Jr., et al., 2013) is: 11.7%   Values used to calculate the score:     Age: 5 years     Sex: Female     Is Non-Hispanic African American: No     Diabetic: No     Tobacco smoker: Yes     Systolic Blood Pressure: [id number] mmHg     Is BP treated: No     HDL Cholesterol: 43 mg/dL     Total Cholesterol: 294 mg/dL  Anxiety: She feels this is increasing. Reports things are going OK with family. She has taken some of her son's Xanax when she has severe moments of panic. She is currently on Prozac 40 mg daily.   Wt Readings from Last 3 Encounters:  01/30/19 155 lb (70.3 kg)  03/08/18 158 lb 3.2 oz (71.8 kg)  09/25/17 158 lb (71.7 kg)   Carpal Tunnel Syndrome: She is having worsening pain and burning in both of her hands. Worse in the morning. She is wearing a carpal tunnel brace on her left hand but not her  right. Was evaluated for surgery before but did not proceed.   Elevated Blood Pressure: Has been slightly elevated the previous two times.   BP Readings from Last 3 Encounters:  01/30/19 (!) 150/92  03/08/18 (!) 146/96  09/25/17 122/86   Tobacco Abuse: She has reduced smoking to 2-3 cigarettes per day.  ----------------------------------------------   Review of Systems  Gastrointestinal: Positive for abdominal distention.  Musculoskeletal: Positive for myalgias.  Neurological: Positive for numbness.  Psychiatric/Behavioral: The patient is nervous/anxious.   All other systems reviewed and are negative.   Social History      She  reports that she has been smoking cigarettes. She has a 6.25 pack-year smoking history. She has never used smokeless tobacco. She reports current alcohol use of about 2.0 standard drinks of alcohol per week. She reports that she does not use drugs.       Dr. Myer Haff [facility] for psychiatry  Social History   Socioeconomic History  . Marital status: Married    Spouse name: Not on file  . Number of children: Not on file  . Years of education: Not on file  . Highest education level: Not on file  Occupational History  .  Not on file  Social Needs  . Financial resource strain: Not on file  . Food insecurity    Worry: Not on file    Inability: Not on file  . Transportation needs    Medical: Not on file    Non-medical: Not on file  Tobacco Use  . Smoking status: Current Every Day Smoker    Packs/day: 0.25    Years: 25.00    Pack years: 6.25    Types: Cigarettes  . Smokeless tobacco: Never Used  Substance and Sexual Activity  . Alcohol use: Yes    Alcohol/week: 2.0 standard drinks    Types: 2 Shots of liquor per week    Comment: Socially  . Drug use: No  . Sexual activity: Not on file  Lifestyle  . Physical activity    Days per week: Not on file    Minutes per session: Not on file  . Stress: Not on file   Relationships  . Social Herbalist on phone: Not on file    Gets together: Not on file    Attends religious service: Not on file    Active member of club or organization: Not on file    Attends meetings of clubs or organizations: Not on file    Relationship status: Not on file  Other Topics Concern  . Not on file  Social History Narrative  . Not on file    Past Medical History:  Diagnosis Date  . Arthritis    hands  . Headache    2 or 3/week  . Hypothyroidism   . PONV (postoperative nausea and vomiting)   . Vertigo    dizzyness/ comes and goes     Patient Active Problem List   Diagnosis Date Noted  . Special screening for malignant neoplasms, colon   . Benign neoplasm of transverse colon   . Polyp of sigmoid colon   . Carpal tunnel syndrome 04/20/2016  . Clinical depression 04/20/2016  . Borderline diabetes 04/20/2016  . Insomnia 11/17/2014  . Allergic rhinitis 09/02/2009  . Anxiety 10/09/2008  . Adult hypothyroidism 08/07/2008  . 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 Current tobacco use 07/01/2008  . Acid reflux 07/01/2008    Past Surgical History:  Procedure Laterality Date  . CESAREAN SECTION     X's 3  . COLONOSCOPY WITH PROPOFOL N/A 06/30/2016   Procedure: COLONOSCOPY WITH PROPOFOL;  Surgeon: Lucilla Lame, MD;  Location: [facility];  Service: Endoscopy;  Laterality: N/A;  . HYSTERECTOMY ABDOMINAL WITH SALPINGECTOMY    . POLYPECTOMY  06/30/2016   Procedure: POLYPECTOMY INTESTINAL;  Surgeon: Lucilla Lame, MD;  Location: [facility];  Service: Endoscopy;;  Transverse colon polyp x 2.  . WRIST SURGERY Left    Removed a cyst    Family History        Family Status  Relation Name Status  . Mother  Alive  . Father  Alive        Her family history includes Hypertension in her father and mother; Stroke in her father.      No Known Allergies   Current Outpatient Medications:  .  FLUoxetine (PROZAC) 40 MG  capsule, TAKE 1 CAPSULE(40 MG) BY MOUTH DAILY, Disp: 90 capsule, Rfl: 0 .  levothyroxine (SYNTHROID) 88 MCG tablet, TAKE 1 TABLET BY MOUTH DAILY, Disp: 90 tablet, Rfl: 0 .  traZODone (DESYREL) 50 MG tablet, TAKE 1/2 TO 1 TABLET(25 TO 50 MG) BY MOUTH AT BEDTIME  AS NEEDED FOR SLEEP, Disp: 90 tablet, Rfl: 0 .  HYDROcodone-acetaminophen (NORCO/VICODIN) 5-325 MG tablet, Take 1/2 tablet every 6 hours as needed for pain. (Patient not taking: Reported on 01/30/2019), Disp: 20 tablet, Rfl: 0   Patient Care Team: Paulene Floor as PCP - General (Physician Assistant)    Objective:    Vitals: BP (!) 147/92 (BP Location: Right Arm, Patient Position: Sitting, Cuff Size: Large)   Pulse 78   Temp (!) 97.5 F (36.4 C) (Oral)   Resp 16   Ht 5\' 2"  (1.575 m)   Wt 155 lb (70.3 kg)   SpO2 96%   BMI 28.35 kg/m    Vitals:   01/30/19 1415  BP: (!) 147/92  Pulse: 78  Resp: 16  Temp: (!) 97.5 F (36.4 C)  TempSrc: Oral  SpO2: 96%  Weight: 155 lb (70.3 kg)  Height: 5\' 2"  (1.575 m)     Physical Exam Constitutional:      Appearance: Normal appearance.  HENT:     Right Ear: Tympanic membrane and ear canal normal.     Left Ear: Tympanic membrane and ear canal normal.  Cardiovascular:     Rate and Rhythm: Normal rate and regular rhythm.     Heart sounds: Normal heart sounds.  Pulmonary:     Effort: Pulmonary effort is normal.     Breath sounds: Normal breath sounds.  Chest:     Breasts:        Right: Normal.        Left: Normal.  Abdominal:     General: Bowel sounds are normal.     Palpations: Abdomen is soft.  Skin:    General: Skin is warm and dry.  Neurological:     Mental Status: She is alert and oriented to person, place, and time. Mental status is at baseline.  Psychiatric:        Mood and Affect: Mood normal.        Behavior: Behavior normal.      Depression Screen PHQ 2/9 Scores 01/30/2019 03/08/2018 07/24/2017 01/13/2017  PHQ - 2 Score 0 0 1 1  PHQ- 9 Score 3 2 5 8         Assessment & Plan:     Routine Health Maintenance and Physical Exam  Exercise Activities and Dietary recommendations Goals   None     Immunization History  Administered Date(s) Administered  . Influenza,inj,Quad PF,6+ Mos 03/08/2018  . Tdap 01/19/2011    Health Maintenance  Topic Date Due  . MAMMOGRAM  11/05/2014  . INFLUENZA VACCINE  12/22/2018  . TETANUS/TDAP  01/18/2021  . COLONOSCOPY  06/30/2026  . HIV Screening  Completed     Discussed health benefits of physical activity, and encouraged her to engage in regular exercise appropriate for her age and condition.    1. Annual physical exam  - CBC with Differential  2. Need for influenza vaccination  - Flu Vaccine QUAD 36+ mos IM  3. Breast cancer screening  Schedule mammogram.   - MM Digital Screening; Future  4. Bilateral carpal tunnel syndrome  Refer to ortho for steroid injection.   - Ambulatory referral to Orthopedic Surgery  5. Anxiety  Medication for PRN use. May need to change maintenance medication.   - hydrOXYzine (ATARAX/VISTARIL) 10 MG tablet; Take 10-20 mg daily as needed.  Dispense: 30 tablet; Refill: 0  6. Depression, unspecified depression type  Continue prozac 40 mg daily.   7. Current tobacco use  Three cigarettes  per day.   8. Hypothyroidism, unspecified type  Stable, continue 88 mcg synthroid daily.  - TSH - Comprehensive Metabolic Panel (CMET)  9. Hypercholesterolemia  Start Lipitor 10 mg QHS for increased cardiovascular risk. Follow up 2 months for labs and to recheck BP.   - Lipid Profile - Direct LDL  10. Hyperglycemia  Prediabetes, counseled on avoiding high sugar foods and weight loss.   - HgB A1c  The entirety of the information documented in the History of Present Illness, Review of Systems and Physical Exam were personally obtained by me. Portions of this information were initially documented by April M. Sabra Heck, CMA and reviewed by me for thoroughness  and accuracy.   --------------------------------------------------------------------    Trinna Post, PA-C  [facility]

## 2019-01-31 ENCOUNTER — Other Ambulatory Visit: Payer: Self-pay

## 2019-01-31 LAB — CBC WITH DIFFERENTIAL/PLATELET
Basophils Absolute: 0.1 10*3/uL (ref 0.0–0.2)
Basos: 1 %
EOS (ABSOLUTE): 0.1 10*3/uL (ref 0.0–0.4)
Eos: 2 %
Hematocrit: 40.9 % (ref 34.0–46.6)
Hemoglobin: 14.2 g/dL (ref 11.1–15.9)
Immature Grans (Abs): 0 10*3/uL (ref 0.0–0.1)
Immature Granulocytes: 0 %
Lymphocytes Absolute: 2.2 10*3/uL (ref 0.7–3.1)
Lymphs: 23 %
MCH: 31.9 pg (ref 26.6–33.0)
MCHC: 34.7 g/dL (ref 31.5–35.7)
MCV: 92 fL (ref 79–97)
Monocytes Absolute: 0.7 10*3/uL (ref 0.1–0.9)
Monocytes: 7 %
Neutrophils Absolute: 6.3 10*3/uL (ref 1.4–7.0)
Neutrophils: 67 %
Platelets: 302 10*3/uL (ref 150–450)
RBC: 4.45 x10E6/uL (ref 3.77–5.28)
RDW: 12.6 % (ref 11.7–15.4)
WBC: 9.4 10*3/uL (ref 3.4–10.8)

## 2019-01-31 LAB — COMPREHENSIVE METABOLIC PANEL
ALT: 24 IU/L (ref 0–32)
AST: 22 IU/L (ref 0–40)
Albumin/Globulin Ratio: 1.8 (ref 1.2–2.2)
Albumin: 4.4 g/dL (ref 3.8–4.9)
Alkaline Phosphatase: 82 IU/L (ref 39–117)
BUN/Creatinine Ratio: 15 (ref 9–23)
BUN: 17 mg/dL (ref 6–24)
Bilirubin Total: 0.3 mg/dL (ref 0.0–1.2)
CO2: 22 mmol/L (ref 20–29)
Calcium: 9.1 mg/dL (ref 8.7–10.2)
Chloride: 104 mmol/L (ref 96–106)
Creatinine, Ser: 1.16 mg/dL — ABNORMAL HIGH (ref 0.57–1.00)
GFR calc Af Amer: 62 mL/min/{1.73_m2} (ref >59)
GFR calc non Af Amer: 54 mL/min/{1.73_m2} — ABNORMAL LOW (ref >59)
Globulin, Total: 2.5 g/dL (ref 1.5–4.5)
Glucose: 97 mg/dL (ref 65–99)
Potassium: 4.3 mmol/L (ref 3.5–5.2)
Sodium: 139 mmol/L (ref 134–144)
Total Protein: 6.9 g/dL (ref 6.0–8.5)

## 2019-01-31 LAB — TSH: TSH: 2.37 u[IU]/mL (ref 0.450–4.500)

## 2019-01-31 LAB — LIPID PANEL
Chol/HDL Ratio: 6.8 ratio — ABNORMAL HIGH (ref 0.0–4.4)
Cholesterol, Total: 294 mg/dL — ABNORMAL HIGH (ref 100–199)
HDL: 43 mg/dL (ref >39)
LDL Chol Calc (NIH): 164 mg/dL — ABNORMAL HIGH (ref 0–99)
Triglycerides: 450 mg/dL — ABNORMAL HIGH (ref 0–149)
VLDL Cholesterol Cal: 87 mg/dL — ABNORMAL HIGH (ref 5–40)

## 2019-01-31 LAB — LDL CHOLESTEROL, DIRECT: LDL Direct: 176 mg/dL — ABNORMAL HIGH (ref 0–99)

## 2019-01-31 LAB — HEMOGLOBIN A1C
Est. average glucose Bld gHb Est-mCnc: 120 mg/dL
Hgb A1c MFr Bld: 5.8 % — ABNORMAL HIGH (ref 4.8–5.6)

## 2019-01-31 MED ORDER — ATORVASTATIN CALCIUM 10 MG PO TABS: 10.0000 mg | ORAL_TABLET | Freq: Every day | ORAL | 1 refills | Status: DC

## 2019-01-31 NOTE — Telephone Encounter (Signed)
-----   Message from Trinna Post, [place] sent at 01/31/2019  1:27 PM EDT ----- A1c is prediabetic. Her cholesterol is high and she is at an increased risk of heart attack and stroke. I would recommend starting 10 mg lipitor nightly and rechecking labs at follow up in 2 months. Lipitor can cause some muscle aches. Kidney function a touch lower than last time, likely due to dehydration. Thyroid lab normal.

## 2019-01-31 NOTE — Telephone Encounter (Signed)
Pt advised.  Please send RX to [facility].   Thanks,   -Mickel Baas

## 2019-04-01 ENCOUNTER — Telehealth (INDEPENDENT_AMBULATORY_CARE_PROVIDER_SITE_OTHER): Payer: 59 | Admitting: Physician Assistant

## 2019-04-01 DIAGNOSIS — R7303 Prediabetes: Secondary | ICD-10-CM

## 2019-04-01 DIAGNOSIS — E785 Hyperlipidemia, unspecified: Secondary | ICD-10-CM

## 2019-04-01 NOTE — Progress Notes (Signed)
Patient: Mary Miller Female    DOB: 02/26/1966   53 y.o.   MRN: [id number] Visit Date: 04/01/2019  Today's Provider: Trinna Post, PA-C   Chief Complaint  Patient presents with  . Hypertension   Subjective:    Virtual Visit via Video Note  I connected with Mary Miller on 04/01/19 at  1:20 PM EST by a video enabled telemedicine application and verified that I am speaking with the correct person using two identifiers.  Location: Patient: Home Provider: Office    I discussed the limitations of evaluation and management by telemedicine and the availability of in person appointments. The patient expressed understanding and agreed to proceed.  Interactive audio and video communications were attempted, although failed due to patient's inability to connect to video. Continued visit with audio only interaction with patient agreement.   HPI  Hypertension, follow-up:  BP Readings from Last 3 Encounters:  01/30/19 (!) 150/92  03/08/18 (!) 146/96  09/25/17 122/86    She was last seen for hypertension 2 months ago.  BP at that visit was elevated. Management changes since that visit include observation. She reports excellent compliance with treatment. She is not having side effects.  She is not exercising. She is not adherent to low salt diet.   Outside blood pressures are normal. She is experiencing none.  Patient denies none.   Cardiovascular risk factors include none.  Use of agents associated with hypertension: none.     Weight trend: stable Wt Readings from Last 3 Encounters:  01/30/19 155 lb (70.3 kg)  03/08/18 158 lb 3.2 oz (71.8 kg)  09/25/17 158 lb (71.7 kg)    Current diet: in general, an "unhealthy" diet  HLD: Started on lipitor 10 mg QHS started at last   Lipid Panel     Component Value Date/Time   CHOL 294 (H) 01/30/2019 1506   TRIG 450 (H) 01/30/2019 1506   HDL 43 01/30/2019 1506   CHOLHDL 6.8 (H) 01/30/2019 1506   LDLCALC 164  (H) 01/30/2019 1506   LDLDIRECT 176 (H) 01/30/2019 1506   LABVLDL 87 (H) 01/30/2019 1506    In the interim she has been to [facility] for bilateral carpal tunnel syndrome. She has seen Dr. Verita Lamb   ------------------------------------------------------------------------     No Known Allergies   Current Outpatient Medications:  .  atorvastatin (LIPITOR) 10 MG tablet, Take 1 tablet (10 mg total) by mouth daily., Disp: 90 tablet, Rfl: 1 .  FLUoxetine (PROZAC) 40 MG capsule, TAKE 1 CAPSULE(40 MG) BY MOUTH DAILY, Disp: 90 capsule, Rfl: 0 .  hydrOXYzine (ATARAX/VISTARIL) 10 MG tablet, Take 10-20 mg daily as needed., Disp: 30 tablet, Rfl: 0 .  levothyroxine (SYNTHROID) 88 MCG tablet, TAKE 1 TABLET BY MOUTH DAILY, Disp: 90 tablet, Rfl: 0 .  traZODone (DESYREL) 50 MG tablet, TAKE 1/2 TO 1 TABLET(25 TO 50 MG) BY MOUTH AT BEDTIME AS NEEDED FOR SLEEP, Disp: 90 tablet, Rfl: 0  Review of Systems  Social History   Tobacco Use  . Smoking status: Current Every Day Smoker    Packs/day: 0.25    Years: 25.00    Pack years: 6.25    Types: Cigarettes  . Smokeless tobacco: Never Used  Substance Use Topics  . Alcohol use: Yes    Alcohol/week: 2.0 standard drinks    Types: 2 Shots of liquor per week    Comment: Socially      Objective:   There were no vitals  taken for this visit. There were no vitals filed for this visit.There is no height or weight on file to calculate BMI.   Physical Exam   No results found for any visits on 04/01/19.     Assessment & Plan    1. Borderline diabetes   2. Hyperlipidemia, unspecified hyperlipidemia type  She is doing well with the lipitor 10 mg QHS. We will recheck lipid panel and determine follow up pending results. She reports her home BP readings are normal.   - Lipid Profile I discussed the assessment and treatment plan with the patient. The patient was provided an opportunity to ask questions and all were answered. The patient  agreed with the plan and demonstrated an understanding of the instructions.   The patient was advised to call back or seek an in-person evaluation if the symptoms worsen or if the condition fails to improve as anticipated.  I provided 15 minutes of non-face-to-face time during this encounter.  The entirety of the information documented in the History of Present Illness, Review of Systems and Physical Exam were personally obtained by me. Portions of this information were initially documented by [facility], CMA and reviewed by me for thoroughness and accuracy.         Trinna Post, PA-C  [facility]

## 2019-04-02 ENCOUNTER — Encounter: Payer: Self-pay | Admitting: Physician Assistant

## 2019-04-04 ENCOUNTER — Ambulatory Visit: Payer: 59 | Admitting: Physician Assistant

## 2019-04-09 ENCOUNTER — Other Ambulatory Visit: Payer: Self-pay | Admitting: Physician Assistant

## 2019-04-09 DIAGNOSIS — G4700 Insomnia, unspecified: Secondary | ICD-10-CM

## 2019-04-09 DIAGNOSIS — F419 Anxiety disorder, unspecified: Secondary | ICD-10-CM

## 2019-04-09 DIAGNOSIS — F329 Major depressive disorder, single episode, unspecified: Secondary | ICD-10-CM

## 2019-04-09 DIAGNOSIS — F32A Depression, unspecified: Secondary | ICD-10-CM

## 2019-04-10 ENCOUNTER — Other Ambulatory Visit: Payer: Self-pay | Admitting: Physician Assistant

## 2019-04-10 DIAGNOSIS — E039 Hypothyroidism, unspecified: Secondary | ICD-10-CM

## 2019-07-05 ENCOUNTER — Other Ambulatory Visit: Payer: Self-pay | Admitting: Physician Assistant

## 2019-07-05 DIAGNOSIS — E039 Hypothyroidism, unspecified: Secondary | ICD-10-CM

## 2019-07-05 DIAGNOSIS — F329 Major depressive disorder, single episode, unspecified: Secondary | ICD-10-CM

## 2019-07-05 DIAGNOSIS — F32A Depression, unspecified: Secondary | ICD-10-CM

## 2019-07-05 DIAGNOSIS — F419 Anxiety disorder, unspecified: Secondary | ICD-10-CM

## 2019-07-05 DIAGNOSIS — G4700 Insomnia, unspecified: Secondary | ICD-10-CM

## 2019-07-05 NOTE — Telephone Encounter (Signed)
Requested Prescriptions  Pending Prescriptions Disp Refills  . FLUoxetine (PROZAC) 40 MG capsule [Pharmacy Med Name: FLUOXETINE 40MG  CAPSULES] 90 capsule 1    Sig: TAKE 1 CAPSULE(40 MG) BY MOUTH DAILY     Psychiatry:  Antidepressants - SSRI Passed - 07/05/2019 11:39 AM      Passed - Completed PHQ-2 or PHQ-9 in the last 360 days.      Passed - Valid encounter within last 6 months    Recent Outpatient Visits          3 months ago Borderline diabetes   [facility] Carles Collet M, PA-C   5 months ago Annual physical exam   [facility] Trinna Post, [place]   1 year ago [place], Wendee Beavers, [place]   1 year ago Adult hypothyroidism   [facility] Trinna Post, [place]   1 year ago Annual physical exam   [facility] Terrilee Croak, Adriana M, PA-C             . traZODone (DESYREL) 50 MG tablet [Pharmacy Med Name: TRAZODONE 50MG  TABLETS] 90 tablet 0    Sig: TAKE 1/2 TO 1 TABLET(25 TO 50 MG) BY MOUTH AT BEDTIME AS NEEDED FOR SLEEP     Psychiatry: Antidepressants - Serotonin Modulator Passed - 07/05/2019 11:39 AM      Passed - Completed PHQ-2 or PHQ-9 in the last 360 days.      Passed - Valid encounter within last 6 months    Recent Outpatient Visits          3 months ago Borderline diabetes   [facility] Carles Collet M, [place]   5 months ago Annual physical exam   [facility] Trinna Post, [place]   1 year ago [place], Wendee Beavers, [place]   1 year ago Adult hypothyroidism   [facility] Trinna Post, [place]   1 year ago Annual physical exam   [facility] Terrilee Croak, Adriana M, [place]             . levothyroxine (SYNTHROID) 88 MCG tablet [Pharmacy Med Name: LEVOTHYROXINE 0.088MG  (88MCG) TAB] 90 tablet 0    Sig: TAKE 1 TABLET BY MOUTH DAILY     Endocrinology:  Hypothyroid Agents Failed -  07/05/2019 11:39 AM      Failed - TSH needs to be rechecked within 3 months after an abnormal result. Refill until TSH is due.      Passed - TSH in normal range and within 360 days    TSH  Date Value Ref Range Status  01/30/2019 2.370 0.450 - 4.500 uIU/mL Final         Passed - Valid encounter within last 12 months    Recent Outpatient Visits          3 months ago Borderline diabetes   [facility] [place], Wendee Beavers, PA-C   5 months ago Annual physical exam   [facility] [place], Wendee Beavers, [place]   1 year ago [place] Trinna Post, [place]   1 year ago Adult hypothyroidism   [facility] Trinna Post, [place]   1 year ago Annual physical exam   [place], [place], [place]

## 2019-07-28 ENCOUNTER — Other Ambulatory Visit: Payer: Self-pay | Admitting: Physician Assistant

## 2019-07-28 NOTE — Telephone Encounter (Signed)
Requested Prescriptions  Pending Prescriptions Disp Refills  . atorvastatin (LIPITOR) 10 MG tablet [Pharmacy Med Name: ATORVASTATIN 10MG  TABLETS] 90 tablet 1    Sig: TAKE 1 TABLET(10 MG) BY MOUTH DAILY     Cardiovascular:  Antilipid - Statins Failed - 07/28/2019 11:26 AM      Failed - Total Cholesterol in normal range and within 360 days    Cholesterol, Total  Date Value Ref Range Status  01/30/2019 294 (H) 100 - 199 mg/dL Final         Failed - LDL in normal range and within 360 days    LDL Chol Calc (NIH)  Date Value Ref Range Status  01/30/2019 164 (H) 0 - 99 mg/dL Final   LDL Direct  Date Value Ref Range Status  01/30/2019 176 (H) 0 - 99 mg/dL Final         Failed - Triglycerides in normal range and within 360 days    Triglycerides  Date Value Ref Range Status  01/30/2019 450 (H) 0 - 149 mg/dL Final         Passed - HDL in normal range and within 360 days    HDL  Date Value Ref Range Status  01/30/2019 43 >39 mg/dL Final         Passed - Patient is not pregnant      Passed - Valid encounter within last 12 months    Recent Outpatient Visits          3 months ago Borderline diabetes   [place], Wendee Beavers, PA-C   5 months ago Annual physical exam   [facility] [place], Wendee Beavers, [place]   1 year ago [place], Wendee Beavers, [place]   1 year ago Adult hypothyroidism   [place], Wendee Beavers, [place]   2 years ago Annual physical exam   [place], [place], [place]

## 2019-09-03 IMAGING — CR DG CHEST 2V
1 series · 2 of 2 positions shown · non-contrast
Comparison: Chest x-ray of January 24, 2012

CLINICAL DATA: The patient fell in her yard 2 weeks ago and has
persistent pain deep to and below the right breast. Current smoker.

EXAM:
CHEST - 2 VIEW

[Series 1: dg chest 2 view · 0.14mm/px · 2 of 2 slices shown]
[im 1/2]
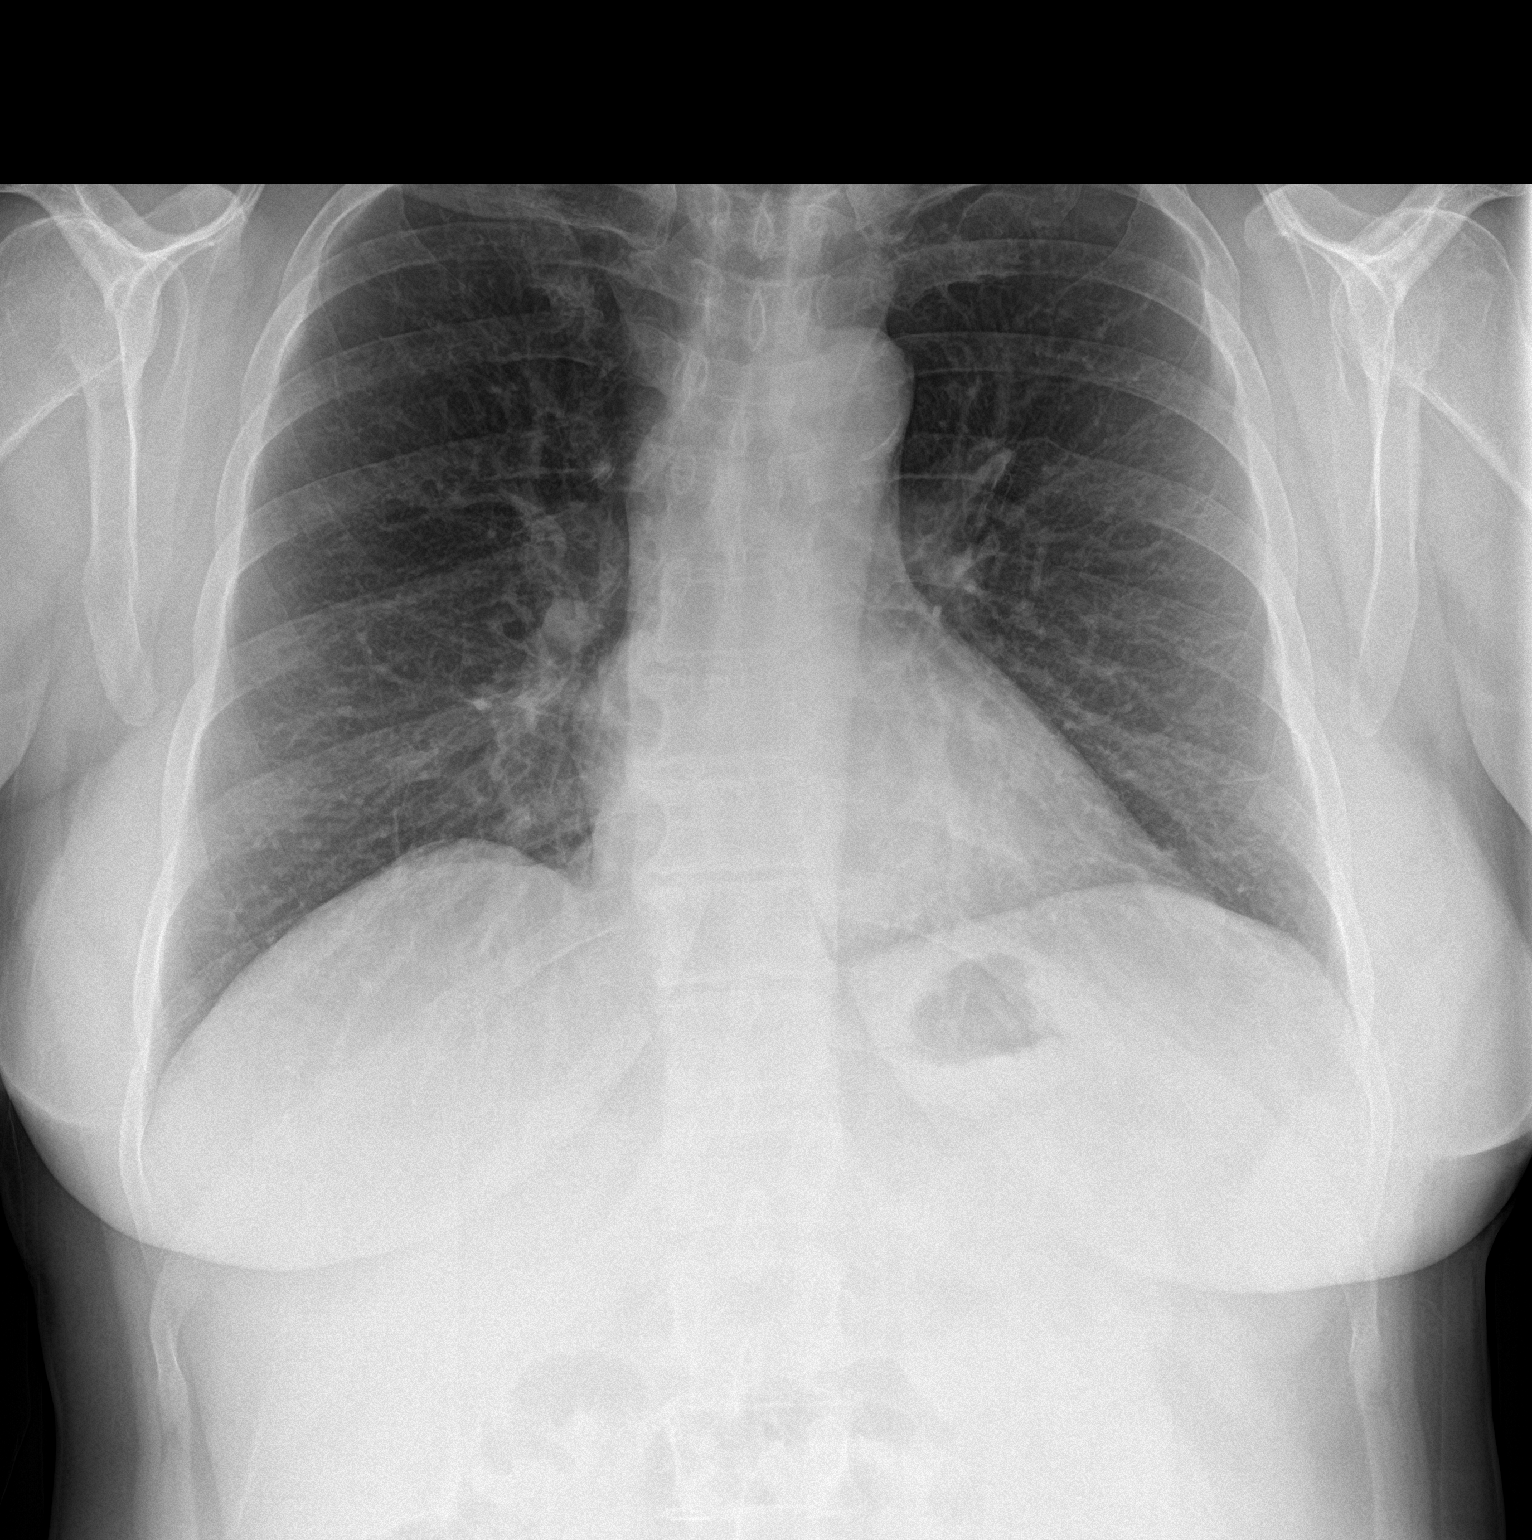
[im 2/2]
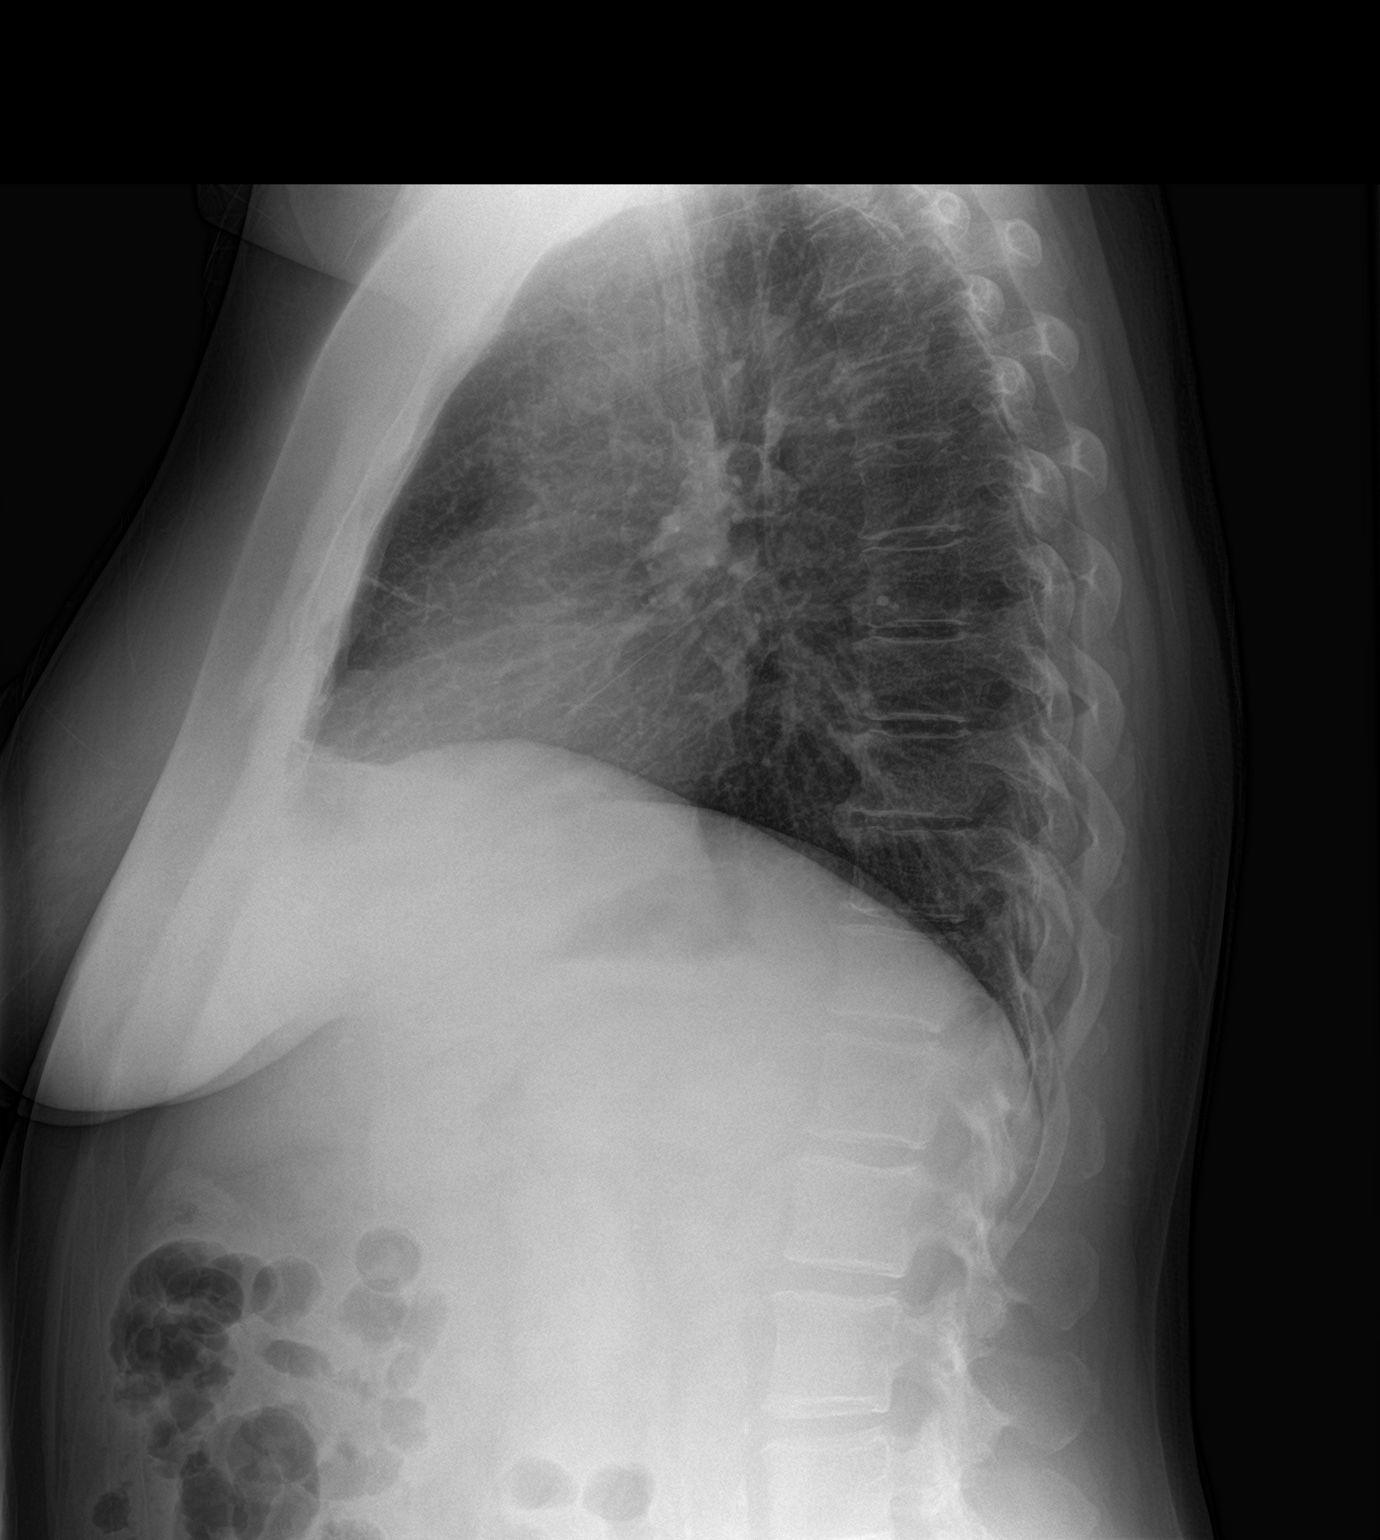

[2 of 2 positions shown; findings below may reference images not displayed]

FINDINGS: The lungs are adequately inflated. There is no focal infiltrate. The
interstitial markings are coarse. There is no pleural effusion or
pneumothorax. The heart and mediastinal structures are normal. There
is calcification in the wall of the aortic arch. The bony thorax
exhibits no acute abnormality.
IMPRESSION: Mild chronic bronchitic-smoking related changes. No acute post
traumatic injury or other acute cardiopulmonary abnormality. The
observed portions of the right ribcage are unremarkable.

Thoracic aortic atherosclerosis.

## 2019-12-22 ENCOUNTER — Other Ambulatory Visit: Payer: Self-pay | Admitting: Physician Assistant

## 2019-12-22 DIAGNOSIS — G4700 Insomnia, unspecified: Secondary | ICD-10-CM

## 2019-12-24 ENCOUNTER — Telehealth (INDEPENDENT_AMBULATORY_CARE_PROVIDER_SITE_OTHER): Payer: 59 | Admitting: Physician Assistant

## 2019-12-24 DIAGNOSIS — J0100 Acute maxillary sinusitis, unspecified: Secondary | ICD-10-CM | POA: Diagnosis not present

## 2019-12-24 DIAGNOSIS — Z20822 Contact with and (suspected) exposure to covid-19: Secondary | ICD-10-CM

## 2019-12-24 MED ORDER — AMOXICILLIN-POT CLAVULANATE 875-125 MG PO TABS: 1.0000 | ORAL_TABLET | Freq: Two times a day (BID) | ORAL | 0 refills | Status: AC

## 2019-12-24 NOTE — Progress Notes (Signed)
MyChart Video Visit    Virtual Visit via Video Note   This visit type was conducted due to national recommendations for restrictions regarding the COVID-19 Pandemic (e.g. social distancing) in an effort to limit this patient's exposure and mitigate transmission in our community. This patient is at least at moderate risk for complications without adequate follow up. This format is felt to be most appropriate for this patient at this time. Physical exam was limited by quality of the video and audio technology used for the visit.   Patient location: Home Provider location: Office    I discussed the limitations of evaluation and management by telemedicine and the availability of in person appointments. The patient expressed understanding and agreed to proceed.   Patient: Mary Miller   DOB: Sep 29, 1965   54 y.o. Female  MRN: [id number] Visit Date: 12/24/2019  Today's healthcare provider: Trinna Post, PA-C   Chief Complaint  Patient presents with  . Sinus Problem   Subjective    Sinus Problem Associated symptoms include congestion and sinus pressure.    Patient reports URI symptoms starting on Tuesday 12/17/2019 with sneezing, runny nose. Since then, worsening sinus pressure, left side worse than right. Had a fever which she measured at 100F, most recently yesterday. She reports a grandchild was positive for RSV and she was around this grandchild. She has been taking Tylenol sinus pills and nyquil without relief. No vomiting, diarrhea, joint pain   Medications: Outpatient Medications Prior to Visit  Medication Sig  . atorvastatin (LIPITOR) 10 MG tablet TAKE 1 TABLET(10 MG) BY MOUTH DAILY  . FLUoxetine (PROZAC) 40 MG capsule TAKE 1 CAPSULE(40 MG) BY MOUTH DAILY  . hydrOXYzine (ATARAX/VISTARIL) 10 MG tablet Take 10-20 mg daily as needed.  Marland Kitchen levothyroxine (SYNTHROID) 88 MCG tablet TAKE 1 TABLET BY MOUTH DAILY  . traZODone (DESYREL) 50 MG tablet TAKE 1/2 TO 1 TABLET(25 TO 50  MG) BY MOUTH AT BEDTIME AS NEEDED FOR SLEEP   No facility-administered medications prior to visit.    Review of Systems  HENT: Positive for congestion, dental problem (teeth pain), sinus pressure and sinus pain.       Objective    There were no vitals taken for this visit.   Physical Exam Constitutional:      Appearance: Normal appearance.  Pulmonary:     Effort: Pulmonary effort is normal. No respiratory distress.  Neurological:     Mental Status: She is alert.  Psychiatric:        Mood and Affect: Mood normal.        Behavior: Behavior normal.        Assessment & Plan    1. Suspected COVID-19 virus infection  Advised to get tested for COVID. Advised to get COVID vaccines  2. Acute non-recurrent maxillary sinusitis  - amoxicillin-clavulanate (AUGMENTIN) 875-125 MG tablet; Take 1 tablet by mouth 2 (two) times daily for 7 days.  Dispense: 14 tablet; Refill: 0    No follow-ups on file.     I discussed the assessment and treatment plan with the patient. The patient was provided an opportunity to ask questions and all were answered. The patient agreed with the plan and demonstrated an understanding of the instructions.   The patient was advised to call back or seek an in-person evaluation if the symptoms worsen or if the condition fails to improve as anticipated.  ITrinna Post, PA-C, have reviewed all documentation for this visit. The documentation on 12/24/19 for the  exam, diagnosis, procedures, and orders are all accurate and complete.   Paulene Floor [facility] [phone] (phone) [phone] (fax)  [place]

## 2020-01-04 ENCOUNTER — Other Ambulatory Visit: Payer: Self-pay | Admitting: Physician Assistant

## 2020-01-04 DIAGNOSIS — F329 Major depressive disorder, single episode, unspecified: Secondary | ICD-10-CM

## 2020-01-04 DIAGNOSIS — F32A Depression, unspecified: Secondary | ICD-10-CM

## 2020-01-04 DIAGNOSIS — F419 Anxiety disorder, unspecified: Secondary | ICD-10-CM

## 2020-01-04 NOTE — Telephone Encounter (Signed)
Requested medication (s) are due for refill today: yes  Requested medication (s) are on the active medication list: yes  Last refill:  07/05/19  Future visit scheduled: no  Notes to clinic:  called pt and LM on VM to call office to schedule appt   Requested Prescriptions  Pending Prescriptions Disp Refills   FLUoxetine (PROZAC) 40 MG capsule [Pharmacy Med Name: FLUOXETINE 40MG  CAPSULES] 90 capsule 1    Sig: TAKE 1 CAPSULE(40 MG) BY MOUTH DAILY      Psychiatry:  Antidepressants - SSRI Failed - 01/04/2020 11:35 AM      Failed - Valid encounter within last 6 months    Recent Outpatient Visits           1 week ago Suspected COVID-19 virus infection   [place], [place], [place]   9 months ago Borderline diabetes   [facility] Carles Collet M, [place]   11 months ago Annual physical exam   [facility] Trinna Post, [place]   1 year ago [place], Wendee Beavers, [place]   2 years ago Adult hypothyroidism   [place], [place], [place]              Passed - Completed PHQ-2 or PHQ-9 in the last 360 days.

## 2020-01-10 NOTE — Telephone Encounter (Signed)
Patient had a virtual visit on 01/04/2020 an no upcoming visit. Medication send into pharmacy.

## 2020-01-31 ENCOUNTER — Other Ambulatory Visit: Payer: Self-pay | Admitting: Physician Assistant

## 2020-03-18 ENCOUNTER — Other Ambulatory Visit: Payer: Self-pay | Admitting: Physician Assistant

## 2020-03-18 DIAGNOSIS — G4700 Insomnia, unspecified: Secondary | ICD-10-CM

## 2020-03-18 NOTE — Telephone Encounter (Signed)
Left message for pt. To make appointment. Courtesy refill.

## 2020-03-30 ENCOUNTER — Other Ambulatory Visit: Payer: Self-pay

## 2020-03-30 ENCOUNTER — Ambulatory Visit (INDEPENDENT_AMBULATORY_CARE_PROVIDER_SITE_OTHER): Payer: 59 | Admitting: Physician Assistant

## 2020-03-30 ENCOUNTER — Encounter: Payer: Self-pay | Admitting: Physician Assistant

## 2020-03-30 VITALS — BP 137/80 | HR 82 | Temp 98.1°F | Wt 160.2 lb

## 2020-03-30 DIAGNOSIS — F32A Depression, unspecified: Secondary | ICD-10-CM

## 2020-03-30 DIAGNOSIS — E782 Mixed hyperlipidemia: Secondary | ICD-10-CM

## 2020-03-30 DIAGNOSIS — F419 Anxiety disorder, unspecified: Secondary | ICD-10-CM

## 2020-03-30 DIAGNOSIS — Z1231 Encounter for screening mammogram for malignant neoplasm of breast: Secondary | ICD-10-CM

## 2020-03-30 DIAGNOSIS — G4700 Insomnia, unspecified: Secondary | ICD-10-CM

## 2020-03-30 DIAGNOSIS — R7303 Prediabetes: Secondary | ICD-10-CM | POA: Diagnosis not present

## 2020-03-30 DIAGNOSIS — E039 Hypothyroidism, unspecified: Secondary | ICD-10-CM

## 2020-03-30 DIAGNOSIS — R634 Abnormal weight loss: Secondary | ICD-10-CM

## 2020-03-30 MED ORDER — TRAZODONE HCL 50 MG PO TABS: ORAL_TABLET | ORAL | 0 refills | Status: DC

## 2020-03-30 MED ORDER — TRAZODONE HCL 50 MG PO TABS: 100.0000 mg | ORAL_TABLET | Freq: Every day | ORAL | 0 refills | Status: DC

## 2020-03-30 MED ORDER — ATORVASTATIN CALCIUM 10 MG PO TABS: ORAL_TABLET | ORAL | 1 refills | Status: DC

## 2020-03-30 NOTE — Patient Instructions (Signed)
Weight Loss Medicines  1. Qysmia 2. Contrave 3. Belviqu 4. Saxenda 5. Wegovy 6. Topamax (off label)

## 2020-03-30 NOTE — Progress Notes (Signed)
Established patient visit   Patient: Mary Miller   DOB: 05-31-1965   54 y.o. Female  MRN: [id number] Visit Date: 03/30/2020  Today's healthcare provider: Trinna Post, PA-C   Chief Complaint  Patient presents with  . Anxiety  . Insomnia  I,Porsha C McClurkin,acting as a scribe for Trinna Post, PA-C.,have documented all relevant documentation on the behalf of Trinna Post, PA-C,as directed by  Trinna Post, PA-C while in the presence of Trinna Post, PA-C.  Subjective    HPI  Anxiety, Follow-up  She was last seen for anxiety 1 years ago. Changes made at last visit include stopped Prozac 40 mg due to not having emotions.   She reports poor compliance with treatment. She reports good tolerance of treatment. She is not having side effects.   She feels her anxiety is mild and Improved since last visit. She is not taking prozac and she does not wish to restart.   Symptoms: No chest pain No difficulty concentrating  No dizziness No fatigue  No feelings of losing control No insomnia  No irritable No palpitations  No panic attacks No racing thoughts  No shortness of breath No sweating  No tremors/shakes    GAD-7 Results GAD-7 Generalized Anxiety Disorder Screening Tool 03/30/2020 03/08/2018 06/02/2016  1. Feeling Nervous, Anxious, or on Edge 0 0 3  2. Not Being Able to Stop or Control Worrying 1 0 3  3. Worrying Too Much About Different Things 3 0 3  4. Trouble Relaxing 1 2 3   5. Being So Restless it's Hard To Sit Still 0 0 1  6. Becoming Easily Annoyed or Irritable 0 0 3  7. Feeling Afraid As If Something Awful Might Happen 0 0 1  Total GAD-7 Score 5 2 17   Difficulty At Work, Home, or Getting  Along With Others? Not difficult at all Somewhat difficult Very difficult    PHQ-9 Scores PHQ9 SCORE ONLY 03/30/2020 01/30/2019 03/08/2018  PHQ-9 Total Score 1 3 2      ---------------------------------------------------------------------------------------------------  Insomnia Follow up  She presents today for follow up of insomnia. She was last seen for this 1 years ago. Management changes included continue current medication. She is not having adverse reaction from treatment.  Insomnia is getting unchanged. She has difficulty FALLING asleep. She does not have difficulty STAYING asleep. She says this is worse than last time.   She is taking stimulant medications.  She is not taking new medications:  She is not taking OTC sleeping aid. . She is taking medications to help sleep. She is not drinking alcohol to help sleep. She is not using illicit drugs.  --------------------------------------------------------------------  Lipid/Cholesterol, Follow-up  Last lipid panel Other pertinent labs  Lab Results  Component Value Date   CHOL 256 (H) 03/30/2020   HDL 39 (L) 03/30/2020   LDLCALC 111 (H) 03/30/2020   LDLDIRECT 176 (H) 01/30/2019   TRIG 608 (HH) 03/30/2020   CHOLHDL 6.6 (H) 03/30/2020   Lab Results  Component Value Date   ALT 24 03/30/2020   AST 20 03/30/2020   PLT 329 03/30/2020   TSH 1.240 03/30/2020     She was last seen for this 1 years ago.  Management since that visit includes lipitor 10 mg QHS.  She reports good compliance with treatment. She is not having side effects.   Symptoms: No chest pain No chest pressure/discomfort  No dyspnea No lower extremity edema  No numbness or tingling of extremity  No orthopnea  No palpitations No paroxysmal nocturnal dyspnea  No speech difficulty No syncope   Current diet: in general, an "unhealthy" diet Current exercise: none  The 10-year ASCVD risk score Mikey Bussing DC Jr., et al., 2013) is: 9.7%  ---------------------------------------------------------------------------------------------------  Prediabetes, Follow-up  Lab Results  Component Value Date   HGBA1C 6.0 (H)  03/30/2020   HGBA1C 5.8 (H) 01/30/2019   GLUCOSE 134 (H) 03/30/2020   GLUCOSE 97 01/30/2019   GLUCOSE 102 (H) 07/24/2017    Last seen for for this1 years ago.  Management since that visit includes diet control. Current symptoms include none and have been unchanged. She reports she was drinking keto shakes and working on dietary changes and lost a moderate amount of weight but then gained it back.   Prior visit with dietician: no Current diet: in general, an "unhealthy" diet Current exercise: none  Pertinent Labs:    Component Value Date/Time   CHOL 256 (H) 03/30/2020 1545   TRIG 608 (HH) 03/30/2020 1545   CHOLHDL 6.6 (H) 03/30/2020 1545   CREATININE 0.79 03/30/2020 1545    Wt Readings from Last 3 Encounters:  03/30/20 160 lb 3.2 oz (72.7 kg)  01/30/19 155 lb (70.3 kg)  03/08/18 158 lb 3.2 oz (71.8 kg)   BMI Readings from Last 3 Encounters:  03/30/20 29.30 kg/m  01/30/19 28.35 kg/m  03/08/18 28.94 kg/m    -----------------------------------------------------------------------------------------       Medications: Outpatient Medications Prior to Visit  Medication Sig  . atorvastatin (LIPITOR) 10 MG tablet TAKE 1 TABLET(10 MG) BY MOUTH DAILY  . levothyroxine (SYNTHROID) 88 MCG tablet TAKE 1 TABLET BY MOUTH DAILY  . traZODone (DESYREL) 50 MG tablet TAKE 1/2 TO 1 TABLET(25 TO 50 MG) BY MOUTH AT BEDTIME AS NEEDED FOR SLEEP  . FLUoxetine (PROZAC) 40 MG capsule TAKE 1 CAPSULE(40 MG) BY MOUTH DAILY (Patient not taking: Reported on 03/30/2020)  . hydrOXYzine (ATARAX/VISTARIL) 10 MG tablet Take 10-20 mg daily as needed. (Patient not taking: Reported on 03/30/2020)   No facility-administered medications prior to visit.    Review of Systems  Constitutional: Negative.   Respiratory: Negative.   Cardiovascular: Negative.   Psychiatric/Behavioral: Negative for agitation, behavioral problems, decreased concentration, sleep disturbance and suicidal ideas. The patient is not  nervous/anxious.       Objective    BP 137/80 (BP Location: Left Arm, Patient Position: Sitting, Cuff Size: Large)   Pulse 82   Temp 98.1 F (36.7 C) (Oral)   Wt 160 lb 3.2 oz (72.7 kg)   SpO2 98%   BMI 29.30 kg/m    Physical Exam Constitutional:      Appearance: Normal appearance. She is normal weight.  HENT:     Right Ear: Tympanic membrane and ear canal normal.     Left Ear: Tympanic membrane and ear canal normal.  Cardiovascular:     Rate and Rhythm: Normal rate and regular rhythm.     Heart sounds: Normal heart sounds.  Pulmonary:     Effort: Pulmonary effort is normal.     Breath sounds: Normal breath sounds.  Abdominal:     General: Bowel sounds are normal.     Palpations: Abdomen is soft.  Musculoskeletal:     Cervical back: Normal range of motion and neck supple.  Lymphadenopathy:     Cervical: No cervical adenopathy.  Skin:    General: Skin is warm and dry.  Neurological:     General: No focal deficit present.  Mental Status: She is alert and oriented to person, place, and time. Mental status is at baseline.  Psychiatric:        Mood and Affect: Mood normal.        Behavior: Behavior normal.       No results found for any visits on 03/30/20.  Assessment & Plan    1. Anxiety  She discontinued prozac and does not wish to restart it at this time.   2. Borderline diabetes  Worsening. Have counseled about dietary and exercise changes. She is interested in weight loss medications. She will contact her insurance and see which are paid for.   - Comprehensive metabolic panel - HgB N8M  3. 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  - Lipid panel - atorvastatin (LIPITOR) 20 MG tablet; TAKE 1 TABLET(20 MG) BY MOUTH DAILY  Dispense: 90 tablet; Refill: 1  5. Adult hypothyroidism  Stable.   - TSH - CBC with Differential/Platelet  6. Encounter  for screening mammogram for malignant neoplasm of breast  Given contact card.  - MM Digital Screening; Future  7. Insomnia, unspecified type  Increase to 100 mg.   - traZODone (DESYREL) 50 MG tablet; Take 2 tablets (100 mg total) by mouth at bedtime. TAKE 1/2 TO 1 TABLET(25 TO 50 MG) BY MOUTH AT BEDTIME AS NEEDED FOR SLEEP  Dispense: 90 tablet; Refill: 0  8. Weight loss  Contact insurance and check coverage. Have written possible medications on AVS.    No follow-ups on file.      ITrinna Post, PA-C, have reviewed all documentation for this visit. The documentation on 04/01/20 for the exam, diagnosis, procedures, and orders are all accurate and complete.  The entirety of the information documented in the History of Present Illness, Review of Systems and Physical Exam were personally obtained by me. Portions of this information were initially documented by [facility] and reviewed by me for thoroughness and accuracy.     Paulene Floor  [facility] [phone] (phone) [phone] (fax)  [place]

## 2020-03-31 LAB — COMPREHENSIVE METABOLIC PANEL
ALT: 24 IU/L (ref 0–32)
AST: 20 IU/L (ref 0–40)
Albumin/Globulin Ratio: 1.6 (ref 1.2–2.2)
Albumin: 4.4 g/dL (ref 3.8–4.9)
Alkaline Phosphatase: 112 IU/L (ref 44–121)
BUN/Creatinine Ratio: 19 (ref 9–23)
BUN: 15 mg/dL (ref 6–24)
Bilirubin Total: <0.2 mg/dL (ref 0.0–1.2)
CO2: 21 mmol/L (ref 20–29)
Calcium: 9.4 mg/dL (ref 8.7–10.2)
Chloride: 104 mmol/L (ref 96–106)
Creatinine, Ser: 0.79 mg/dL (ref 0.57–1.00)
GFR calc Af Amer: 97 mL/min/{1.73_m2} (ref >59)
GFR calc non Af Amer: 85 mL/min/{1.73_m2} (ref >59)
Globulin, Total: 2.7 g/dL (ref 1.5–4.5)
Glucose: 134 mg/dL — ABNORMAL HIGH (ref 65–99)
Potassium: 4.1 mmol/L (ref 3.5–5.2)
Sodium: 140 mmol/L (ref 134–144)
Total Protein: 7.1 g/dL (ref 6.0–8.5)

## 2020-03-31 LAB — CBC WITH DIFFERENTIAL/PLATELET
Basophils Absolute: 0.1 10*3/uL (ref 0.0–0.2)
Basos: 1 %
EOS (ABSOLUTE): 0.3 10*3/uL (ref 0.0–0.4)
Eos: 3 %
Hematocrit: 38.5 % (ref 34.0–46.6)
Hemoglobin: 13.3 g/dL (ref 11.1–15.9)
Immature Grans (Abs): 0 10*3/uL (ref 0.0–0.1)
Immature Granulocytes: 1 %
Lymphocytes Absolute: 2.6 10*3/uL (ref 0.7–3.1)
Lymphs: 29 %
MCH: 31.7 pg (ref 26.6–33.0)
MCHC: 34.5 g/dL (ref 31.5–35.7)
MCV: 92 fL (ref 79–97)
Monocytes Absolute: 0.7 10*3/uL (ref 0.1–0.9)
Monocytes: 8 %
Neutrophils Absolute: 5.1 10*3/uL (ref 1.4–7.0)
Neutrophils: 58 %
Platelets: 329 10*3/uL (ref 150–450)
RBC: 4.19 x10E6/uL (ref 3.77–5.28)
RDW: 12.1 % (ref 11.7–15.4)
WBC: 8.8 10*3/uL (ref 3.4–10.8)

## 2020-03-31 LAB — LIPID PANEL
Chol/HDL Ratio: 6.6 ratio — ABNORMAL HIGH (ref 0.0–4.4)
Cholesterol, Total: 256 mg/dL — ABNORMAL HIGH (ref 100–199)
HDL: 39 mg/dL — ABNORMAL LOW (ref >39)
LDL Chol Calc (NIH): 111 mg/dL — ABNORMAL HIGH (ref 0–99)
Triglycerides: 608 mg/dL (ref 0–149)
VLDL Cholesterol Cal: 106 mg/dL — ABNORMAL HIGH (ref 5–40)

## 2020-03-31 LAB — HEMOGLOBIN A1C
Est. average glucose Bld gHb Est-mCnc: 126 mg/dL
Hgb A1c MFr Bld: 6 % — ABNORMAL HIGH (ref 4.8–5.6)

## 2020-03-31 LAB — TSH: TSH: 1.24 u[IU]/mL (ref 0.450–4.500)

## 2020-04-01 MED ORDER — ATORVASTATIN CALCIUM 20 MG PO TABS: 20.0000 mg | ORAL_TABLET | Freq: Every day | ORAL | 1 refills | Status: DC

## 2020-04-05 ENCOUNTER — Other Ambulatory Visit: Payer: Self-pay | Admitting: Physician Assistant

## 2020-04-05 DIAGNOSIS — E039 Hypothyroidism, unspecified: Secondary | ICD-10-CM

## 2020-04-23 ENCOUNTER — Telehealth: Payer: Self-pay

## 2020-04-23 DIAGNOSIS — E669 Obesity, unspecified: Secondary | ICD-10-CM

## 2020-04-23 NOTE — Telephone Encounter (Signed)
Copied from [place] [id number]. Topic: General - Other >> Apr 23, 2020 11:49 AM Celene Kras wrote: Reason for CRM: PT calling stating that she got in contact with insurance and that they have advised her that they will cover Contrave, saxenda, wegovy ( has to have PA), Topomax. Pt is requesting to have a call back to discuss these. Please advise.

## 2020-04-24 ENCOUNTER — Telehealth: Payer: Self-pay

## 2020-04-24 DIAGNOSIS — G4700 Insomnia, unspecified: Secondary | ICD-10-CM

## 2020-04-24 DIAGNOSIS — R634 Abnormal weight loss: Secondary | ICD-10-CM

## 2020-04-24 MED ORDER — TRAZODONE HCL 50 MG PO TABS: 100.0000 mg | ORAL_TABLET | Freq: Every evening | ORAL | 0 refills | Status: DC | PRN

## 2020-04-24 NOTE — Telephone Encounter (Signed)
Copied from [place] [phone]. Topic: General - Other >> Apr 23, 2020 11:49 AM Celene Kras wrote: Reason for CRM: PT calling stating that she got in contact with insurance and that they have advised her that they will cover Contrave, saxenda, wegovy ( has to have PA), Topomax. Pt is requesting to have a call back to discuss these. Please advise. >> Apr 24, 2020 10:57 AM Leward Quan A wrote: Patient called in to inform Carles Collet that RX for traZODone (DESYREL) 50 MG tablet  Sent to the pharmacy on 03/30/20 for 90 day supply was not received and she is completely out. Also need Rx for medication previously mentioned. Call Ph# [phone]

## 2020-04-24 NOTE — Telephone Encounter (Signed)
Patient states that she will try the Contrave.

## 2020-04-24 NOTE — Telephone Encounter (Signed)
She can have whichever one she wants, I think they are all good. Let me know what she'd like me to try for and I will send it in for her.

## 2020-04-27 MED ORDER — CONTRAVE 8-90 MG PO TB12: ORAL_TABLET | ORAL | 0 refills | Status: DC

## 2020-04-27 NOTE — Telephone Encounter (Signed)
Called patient and left vm advising her that medication was send into pharmacy.

## 2020-04-27 NOTE — Addendum Note (Signed)
Addended by: Trinna Post on: 04/27/2020 02:45 PM   Modules accepted: Orders

## 2020-04-27 NOTE — Telephone Encounter (Signed)
Sent contrave in. We will see her at f/u in January.

## 2020-05-01 NOTE — Telephone Encounter (Signed)
Patient is calling to let Fabio Bering know that the insurance now will only weight loss pill approved was Troperimate 10mg  is $10 for a $90 supply.  Preferred [place] [facility]- [phone]

## 2020-05-01 NOTE — Telephone Encounter (Signed)
Please advise 

## 2020-05-02 ENCOUNTER — Other Ambulatory Visit: Payer: Self-pay | Admitting: Physician Assistant

## 2020-05-02 DIAGNOSIS — G4700 Insomnia, unspecified: Secondary | ICD-10-CM

## 2020-05-04 MED ORDER — TOPIRAMATE 50 MG PO TABS: ORAL_TABLET | ORAL | 0 refills | Status: DC

## 2020-05-04 NOTE — Telephone Encounter (Signed)
Topamax sent in, can cause some drowsiness, taste disturbance, numbness and tingling in hands and feet. Take at night if feeling drowsy.

## 2020-05-04 NOTE — Addendum Note (Signed)
Addended by: Trinna Post on: 05/04/2020 04:43 PM   Modules accepted: Orders

## 2020-05-05 NOTE — Telephone Encounter (Signed)
Advised patient as below.  

## 2020-06-02 ENCOUNTER — Telehealth (INDEPENDENT_AMBULATORY_CARE_PROVIDER_SITE_OTHER): Payer: 59 | Admitting: Physician Assistant

## 2020-06-02 DIAGNOSIS — R509 Fever, unspecified: Secondary | ICD-10-CM

## 2020-06-02 DIAGNOSIS — Z20822 Contact with and (suspected) exposure to covid-19: Secondary | ICD-10-CM

## 2020-06-02 MED ORDER — PROMETHAZINE-DM 6.25-15 MG/5ML PO SYRP: 5.0000 mL | ORAL_SOLUTION | Freq: Three times a day (TID) | ORAL | 0 refills | Status: DC | PRN

## 2020-06-02 NOTE — Progress Notes (Signed)
MyChart Video Visit    Virtual Visit via Video Note   This visit type was conducted due to national recommendations for restrictions regarding the COVID-19 Pandemic (e.g. social distancing) in an effort to limit this patient's exposure and mitigate transmission in our community. This patient is at least at moderate risk for complications without adequate follow up. This format is felt to be most appropriate for this patient at this time. Physical exam was limited by quality of the video and audio technology used for the visit.   Patient location: Home Provider location: Office    I discussed the limitations of evaluation and management by telemedicine and the availability of in person appointments. The patient expressed understanding and agreed to proceed.  Patient: Mary Miller   DOB: [birth year]   55 y.o. Female  MRN: [id number] Visit Date: 06/02/2020  Today's healthcare provider: Trinna Post, PA-C   Chief Complaint  Patient presents with  . URI  I,Cheynne Virden M Addiel Mccardle,acting as a scribe for [employer], PA-C.,have documented all relevant documentation on the behalf of Trinna Post, PA-C,as directed by  Trinna Post, PA-C while in the presence of Trinna Post, PA-C.  Subjective    URI  This is a new problem. The current episode started in the past 7 days. The problem has been unchanged. The maximum temperature recorded prior to her arrival was 101 - 101.9 F. Associated symptoms include coughing, headaches, sinus pain, sneezing and a sore throat. Pertinent negatives include no congestion, diarrhea, nausea, rhinorrhea, vomiting or wheezing. She has tried acetaminophen, NSAIDs, antihistamine and increased fluids for the symptoms. The treatment provided mild relief.    She reports she had symptoms starting yesterday including fever, cough. She has had positive exposure to [place] with nearby coworker. Last week she had some vomiting and nausea. She has sinus pressure.  She is not vaccinated.   Weight Loss: She has started topamax two months ago. She has lost some weight since then, she is not sure specifically but notices her clothes are feeling better.   Medications: Outpatient Medications Prior to Visit  Medication Sig  . atorvastatin (LIPITOR) 20 MG tablet Take 1 tablet (20 mg total) by mouth daily.  Marland Kitchen levothyroxine (SYNTHROID) 88 MCG tablet TAKE 1 TABLET BY MOUTH DAILY  . topiramate (TOPAMAX) 50 MG tablet 1/2 tablet every morning for 14 days, then increase to 1 tablet every morning for 14 days, then 2 tablets every morning  . traZODone (DESYREL) 50 MG tablet Take 2 tablets (100 mg total) by mouth at bedtime as needed for sleep.   No facility-administered medications prior to visit.    Review of Systems  Constitutional: Positive for chills, fatigue and fever (101.8 yesterday,06/01/2020). Negative for appetite change.  HENT: Positive for postnasal drip, sinus pressure, sinus pain, sneezing and sore throat. Negative for congestion and rhinorrhea.   Respiratory: Positive for cough. Negative for chest tightness, shortness of breath and wheezing.   Gastrointestinal: Negative for diarrhea, nausea and vomiting.  Neurological: Positive for weakness and headaches.      Objective    There were no vitals taken for this visit.   Physical Exam Constitutional:      Appearance: Normal appearance.  Pulmonary:     Effort: Pulmonary effort is normal. No respiratory distress.  Neurological:     Mental Status: She is alert.  Psychiatric:        Mood and Affect: Mood normal.        Behavior:  Behavior normal.        Assessment & Plan    1. Close exposure to COVID-19 virus  - symptoms and exam c/w viral URI  - no evidence of strep pharyngitis, CAP, AOM, bacterial sinusitis, or other bacterial infection - concern for possible COVID19 infection - will send for outpatient testing - discussed need to quarantine 10 days from start of symptoms and until  fever-free for at least 48 hours - discussed need to quarantine household members - discussed symptomatic management, natural course, and return precautions -Counseled on low likelihood of her getting into infusion treatment in our system, advised her to look into other locations if interested  - COVID-19, Flu A+B and RSV - promethazine-dextromethorphan (PROMETHAZINE-DM) 6.25-15 MG/5ML syrup; Take 5 mLs by mouth 3 (three) times daily as needed.  Dispense: 118 mL; Refill: 0  2. Fever, unspecified fever cause  - COVID-19, Flu A+B and RSV - promethazine-dextromethorphan (PROMETHAZINE-DM) 6.25-15 MG/5ML syrup; Take 5 mLs by mouth 3 (three) times daily as needed.  Dispense: 118 mL; Refill: 0   No follow-ups on file.     I discussed the assessment and treatment plan with the patient. The patient was provided an opportunity to ask questions and all were answered. The patient agreed with the plan and demonstrated an understanding of the instructions.   The patient was advised to call back or seek an in-person evaluation if the symptoms worsen or if the condition fails to improve as anticipated.   ITrinna Post, PA-C, have reviewed all documentation for this visit. The documentation on 06/02/20 for the exam, diagnosis, procedures, and orders are all accurate and complete.  The entirety of the information documented in the History of Present Illness, Review of Systems and Physical Exam were personally obtained by me. Portions of this information were initially documented by [facility] and reviewed by me for thoroughness and accuracy.    Paulene Floor [facility] [phone] (phone) [phone] (fax)  [place]

## 2020-06-05 LAB — COVID-19, FLU A+B AND RSV
Influenza A, NAA: NOT DETECTED
Influenza B, NAA: NOT DETECTED
RSV, NAA: NOT DETECTED
SARS-CoV-2, NAA: DETECTED — AB

## 2020-07-10 ENCOUNTER — Other Ambulatory Visit: Payer: Self-pay | Admitting: Physician Assistant

## 2020-07-10 DIAGNOSIS — E039 Hypothyroidism, unspecified: Secondary | ICD-10-CM

## 2020-07-23 ENCOUNTER — Other Ambulatory Visit: Payer: Self-pay | Admitting: Physician Assistant

## 2020-07-23 DIAGNOSIS — R634 Abnormal weight loss: Secondary | ICD-10-CM

## 2020-07-23 NOTE — Telephone Encounter (Signed)
Requested medication (s) are due for refill today: yes  Requested medication (s) are on the active medication list: yes  Last refill:  05/04/20 #90 0 refills  Future visit scheduled: yes in 3 months  Notes to clinic:  not delegated per protocol     Requested Prescriptions  Pending Prescriptions Disp Refills   topiramate (TOPAMAX) 50 MG tablet [Pharmacy Med Name: TOPIRAMATE 50MG  TABLETS] 90 tablet 0    Sig: TAKE 1/2 TABLET EVERY MORNING FOR 14 DAYS, THEN INCREASE TO 1 TABLET EVERY MORNING FOR 14 DAYS, THEN 2 TABLETS EVERY MORNING      Not Delegated - Neurology: Anticonvulsants - topiramate & zonisamide Failed - 07/23/2020 11:28 AM      Failed - This refill cannot be delegated      Passed - Cr in normal range and within 360 days    Creatinine, Ser  Date Value Ref Range Status  03/30/2020 0.79 0.57 - 1.00 mg/dL Final          Passed - CO2 in normal range and within 360 days    CO2  Date Value Ref Range Status  03/30/2020 21 20 - 29 mmol/L Final          Passed - Valid encounter within last 12 months    Recent Outpatient Visits           1 month ago Close exposure to COVID-19 virus   [facility], Wendee Beavers, PA-C   3 months ago [place], [place], [place]   7 months ago Suspected COVID-19 virus infection   [facility] [place], [place], [place]   1 year ago Borderline diabetes   [facility] [place], Wendee Beavers, [place]   1 year ago Annual physical exam   [facility] Trinna Post, [place]       Future Appointments             In 3 months Trinna Post, [place], [place]

## 2020-07-24 ENCOUNTER — Other Ambulatory Visit: Payer: Self-pay | Admitting: Physician Assistant

## 2020-07-24 DIAGNOSIS — R634 Abnormal weight loss: Secondary | ICD-10-CM

## 2020-07-25 NOTE — Telephone Encounter (Signed)
Refilled yesterday- NT PEC not delegated to refill or refuse this medication.

## 2020-07-28 ENCOUNTER — Other Ambulatory Visit: Payer: Self-pay | Admitting: Physician Assistant

## 2020-07-28 DIAGNOSIS — G4700 Insomnia, unspecified: Secondary | ICD-10-CM

## 2020-08-18 ENCOUNTER — Other Ambulatory Visit: Payer: Self-pay

## 2020-08-18 DIAGNOSIS — G4700 Insomnia, unspecified: Secondary | ICD-10-CM

## 2020-08-18 NOTE — Telephone Encounter (Signed)
Patient requesting a 90 day refill on the following medication. Former Writer patient and is scheduled to see Simona Huh in June for CPE and f/u.    traZODone (DESYREL) 50 MG tablet   Pharmacy:[facility] [place]

## 2020-08-19 MED ORDER — TRAZODONE HCL 50 MG PO TABS: 100.0000 mg | ORAL_TABLET | Freq: Every evening | ORAL | 0 refills | Status: DC | PRN

## 2020-10-02 ENCOUNTER — Other Ambulatory Visit: Payer: Self-pay | Admitting: Family Medicine

## 2020-10-02 DIAGNOSIS — G4700 Insomnia, unspecified: Secondary | ICD-10-CM

## 2020-10-12 ENCOUNTER — Ambulatory Visit: Payer: Self-pay

## 2020-10-12 NOTE — Progress Notes (Signed)
Acute Office Visit  Subjective:    Patient ID: Mary Miller, female    DOB: 1966-02-07, 55 y.o.   MRN: [id number]  Chief Complaint  Patient presents with  . Abdominal Pain    HPI Patient is in today for right sided abdominal pain. She reports that she has had the pain X 3 weeks. She reports that it radiates to her upper back and to her right shoulder. She has taken Tylenol and ibuprofen with no relief. She does have changes in her bowel movement. No changes in urine.   Past Medical History:  Diagnosis Date  . Arthritis    hands  . Headache    2 or 3/week  . Hypothyroidism   . PONV (postoperative nausea and vomiting)   . Vertigo    dizzyness/ comes and goes    Past Surgical History:  Procedure Laterality Date  . CESAREAN SECTION     X's 3  . COLONOSCOPY WITH PROPOFOL N/A 06/30/2016   Procedure: COLONOSCOPY WITH PROPOFOL;  Surgeon: Lucilla Lame, MD;  Location: [place];  Service: Endoscopy;  Laterality: N/A;  . HYSTERECTOMY ABDOMINAL WITH SALPINGECTOMY    . POLYPECTOMY  06/30/2016   Procedure: POLYPECTOMY INTESTINAL;  Surgeon: Lucilla Lame, MD;  Location: [place];  Service: Endoscopy;;  Transverse colon polyp x 2.  . WRIST SURGERY Left    Removed a cyst    Family History  Problem Relation Age of Onset  . Hypertension Mother   . Hypertension Father   . Stroke Father     Social History   Socioeconomic History  . Marital status: Married    Spouse name: Not on file  . Number of children: Not on file  . Years of education: Not on file  . Highest education level: Not on file  Occupational History  . Not on file  Tobacco Use  . Smoking status: Current Every Day Smoker    Packs/day: 0.25    Years: 25.00    Pack years: 6.25    Types: Cigarettes  . Smokeless tobacco: Never Used  Vaping Use  . Vaping Use: Never used  Substance and Sexual Activity  . Alcohol use: Yes    Alcohol/week: 2.0 standard drinks    Types: 2 Shots of liquor per week     Comment: Socially  . Drug use: No  . Sexual activity: Not on file  Other Topics Concern  . Not on file  Social History Narrative  . Not on file   Social Determinants of Health   Financial Resource Strain: Not on file  Food Insecurity: Not on file  Transportation Needs: Not on file  Physical Activity: Not on file  Stress: Not on file  Social Connections: Not on file  Intimate Partner Violence: Not on file    Outpatient Medications Prior to Visit  Medication Sig Dispense Refill  . atorvastatin (LIPITOR) 20 MG tablet Take 1 tablet (20 mg total) by mouth daily. 90 tablet 1  . levothyroxine (SYNTHROID) 88 MCG tablet TAKE 1 TABLET BY MOUTH DAILY 90 tablet 0  . topiramate (TOPAMAX) 50 MG tablet TAKE 1/2 TABLET EVERY MORNING FOR 14 DAYS, THEN INCREASE TO 1 TABLET EVERY MORNING FOR 14 DAYS, THEN 2 TABLETS EVERY MORNING 90 tablet 0  . traZODone (DESYREL) 50 MG tablet TAKE 2 TABLETS(100 MG) BY MOUTH AT BEDTIME AS NEEDED FOR SLEEP 60 tablet 0  . promethazine-dextromethorphan (PROMETHAZINE-DM) 6.25-15 MG/5ML syrup Take 5 mLs by mouth 3 (three) times daily as needed. (  Patient not taking: Reported on 10/13/2020) 118 mL 0   No facility-administered medications prior to visit.    No Known Allergies  Review of Systems  Constitutional: Negative.   HENT: Negative.   Respiratory: Negative.   Cardiovascular: Negative.   Gastrointestinal: Positive for abdominal pain, diarrhea and nausea. Negative for abdominal distention, blood in stool, constipation, rectal pain and vomiting.  Musculoskeletal: Positive for back pain.  Neurological: Negative for dizziness and headaches.       Objective:    Physical Exam Constitutional:      General: She is not in acute distress.    Appearance: She is well-developed.  HENT:     Head: Normocephalic and atraumatic.     Right Ear: Hearing normal.     Left Ear: Hearing normal.     Nose: Nose normal.  Eyes:     General: Lids are normal. No scleral  icterus.       Right eye: No discharge.        Left eye: No discharge.     Conjunctiva/sclera: Conjunctivae normal.  Cardiovascular:     Rate and Rhythm: Normal rate and regular rhythm.  Pulmonary:     Effort: Pulmonary effort is normal. No respiratory distress.     Breath sounds: Normal breath sounds.  Abdominal:     General: Bowel sounds are normal.     Tenderness: There is abdominal tenderness in the right upper quadrant.  Musculoskeletal:        General: Normal range of motion.  Skin:    Findings: No lesion or rash.  Neurological:     Mental Status: She is alert and oriented to person, place, and time.  Psychiatric:        Speech: Speech normal.        Behavior: Behavior normal.        Thought Content: Thought content normal.     BP (!) 156/85   Pulse 65   Temp 98.3 F (36.8 C)   Resp 16   Ht 5\' 2"  (1.575 m)   Wt 153 lb (69.4 kg)   BMI 27.98 kg/m  Wt Readings from Last 3 Encounters:  10/13/20 153 lb (69.4 kg)  03/30/20 160 lb 3.2 oz (72.7 kg)  01/30/19 155 lb (70.3 kg)    Health Maintenance Due  Topic Date Due  . COVID-19 Vaccine (1) Never done  . Hepatitis C Screening  Never done  . MAMMOGRAM  Never done    There are no preventive care reminders to display for this patient.   Lab Results  Component Value Date   TSH 1.240 03/30/2020   Lab Results  Component Value Date   WBC 8.8 03/30/2020   HGB 13.3 03/30/2020   HCT 38.5 03/30/2020   MCV 92 03/30/2020   PLT 329 03/30/2020   Lab Results  Component Value Date   NA 140 03/30/2020   K 4.1 03/30/2020   CO2 21 03/30/2020   GLUCOSE 134 (H) 03/30/2020   BUN 15 03/30/2020   CREATININE 0.79 03/30/2020   BILITOT <0.2 03/30/2020   ALKPHOS 112 03/30/2020   AST 20 03/30/2020   ALT 24 03/30/2020   PROT 7.1 03/30/2020   ALBUMIN 4.4 03/30/2020   CALCIUM 9.4 03/30/2020   Lab Results  Component Value Date   CHOL 256 (H) 03/30/2020   Lab Results  Component Value Date   HDL 39 (L) 03/30/2020   Lab  Results  Component Value Date   LDLCALC 111 (H) 03/30/2020  Lab Results  Component Value Date   TRIG 608 ([place]) 03/30/2020   Lab Results  Component Value Date   CHOLHDL 6.6 (H) 03/30/2020   Lab Results  Component Value Date   HGBA1C 6.0 (H) 03/30/2020       Assessment & Plan:    1. RUQ abdominal pain Onset over the past 3 weeks with nausea. Some diarrhea and increase in gas the past few days. No fever or urinary symptoms. Suspect acute GB disease. Will get U/S today and stat labs. - CBC with Differential/Platelet - Comprehensive metabolic panel - Lipase - US Abdomen Limited RUQ (LIVER/GB)      Stephanie Acre, CMA   I, Nanetta Wiegman, PA-C, have reviewed all documentation for this visit. The documentation on 10/13/20 for the exam, diagnosis, procedures, and orders are all accurate and complete.

## 2020-10-12 NOTE — Telephone Encounter (Signed)
Pt. Reports she has been having abdominal pain x 3 weeks. Getting worse. Hurts right upper quadrant "under my ribs.Sometimes hurts into my back and shoulder blade." Pain 7/10. Appointment made for tomorrow. Instructed to go to ED for worsening of symptoms.  Reason for Disposition . [1] MILD-MODERATE pain AND [2] constant AND [3] present > 2 hours  Answer Assessment - Initial Assessment Questions 1. LOCATION: "Where does it hurt?"      Right upper side under ribs 2. RADIATION: "Does the pain shoot anywhere else?" (e.g., chest, back)     Back at times and shoulder blade 3. ONSET: "When did the pain begin?" (e.g., minutes, hours or days ago)      3 weeks 4. SUDDEN: "Gradual or sudden onset?"     Getting worse 5. PATTERN "Does the pain come and go, or is it constant?"    - If constant: "Is it getting better, staying the same, or worsening?"      (Note: Constant means the pain never goes away completely; most serious pain is constant and it progresses)     - If intermittent: "How long does it last?" "Do you have pain now?"     (Note: Intermittent means the pain goes away completely between bouts)     Constant 6. SEVERITY: "How bad is the pain?"  (e.g., Scale 1-10; mild, moderate, or severe)   - MILD (1-3): doesn't interfere with normal activities, abdomen soft and not tender to touch    - MODERATE (4-7): interferes with normal activities or awakens from sleep, abdomen tender to touch    - SEVERE (8-10): excruciating pain, doubled over, unable to do any normal activities      Now - 7 7. RECURRENT SYMPTOM: "Have you ever had this type of stomach pain before?" If Yes, ask: "When was the last time?" and "What happened that time?"      No 8. CAUSE: "What do you think is causing the stomach pain?"     Gallbladder 9. RELIEVING/AGGRAVATING FACTORS: "What makes it better or worse?" (e.g., movement, antacids, bowel movement)     No 10. OTHER SYMPTOMS: "Do you have any other symptoms?" (e.g., back  pain, diarrhea, fever, urination pain, vomiting)       Loose stools 11. PREGNANCY: "Is there any chance you are pregnant?" "When was your last menstrual period?"       No  Protocols used: ABDOMINAL PAIN - [place] [facility]

## 2020-10-13 ENCOUNTER — Other Ambulatory Visit
Admission: RE | Admit: 2020-10-13 | Discharge: 2020-10-13 | Disposition: A | Discharge status: Home / Self Care | Payer: 59 | Attending: Family Medicine | Admitting: Family Medicine

## 2020-10-13 ENCOUNTER — Encounter: Payer: Self-pay | Admitting: Family Medicine

## 2020-10-13 ENCOUNTER — Other Ambulatory Visit: Payer: Self-pay

## 2020-10-13 ENCOUNTER — Ambulatory Visit: Payer: 59 | Admitting: Family Medicine

## 2020-10-13 VITALS — BP 156/85 | HR 65 | Temp 98.3°F | Resp 16 | Ht 62.0 in | Wt 153.0 lb

## 2020-10-13 DIAGNOSIS — R1011 Right upper quadrant pain: Secondary | ICD-10-CM | POA: Diagnosis present

## 2020-10-13 LAB — CBC WITH DIFFERENTIAL/PLATELET
Abs Immature Granulocytes: 0.14 10*3/uL — ABNORMAL HIGH (ref 0.00–0.07)
Basophils Absolute: 0.1 10*3/uL (ref 0.0–0.1)
Basophils Relative: 1 %
Eosinophils Absolute: 0.2 10*3/uL (ref 0.0–0.5)
Eosinophils Relative: 3 %
HCT: 44.2 % (ref 36.0–46.0)
Hemoglobin: 15 g/dL (ref 12.0–15.0)
Immature Granulocytes: 2 %
Lymphocytes Relative: 25 %
Lymphs Abs: 2.1 10*3/uL (ref 0.7–4.0)
MCH: 32.2 pg (ref 26.0–34.0)
MCHC: 33.9 g/dL (ref 30.0–36.0)
MCV: 94.8 fL (ref 80.0–100.0)
Monocytes Absolute: 0.6 10*3/uL (ref 0.1–1.0)
Monocytes Relative: 7 %
Neutro Abs: 5 10*3/uL (ref 1.7–7.7)
Neutrophils Relative %: 62 %
Platelets: 314 10*3/uL (ref 150–400)
RBC: 4.66 MIL/uL (ref 3.87–5.11)
RDW: 13.2 % (ref 11.5–15.5)
WBC: 8.1 10*3/uL (ref 4.0–10.5)
nRBC: 0 % (ref 0.0–0.2)

## 2020-10-13 LAB — COMPREHENSIVE METABOLIC PANEL
ALT: 23 U/L (ref 0–44)
AST: 24 U/L (ref 15–41)
Albumin: 4.4 g/dL (ref 3.5–5.0)
Alkaline Phosphatase: 79 U/L (ref 38–126)
Anion gap: 9 (ref 5–15)
BUN: 18 mg/dL (ref 6–20)
CO2: 24 mmol/L (ref 22–32)
Calcium: 9.1 mg/dL (ref 8.9–10.3)
Chloride: 106 mmol/L (ref 98–111)
Creatinine, Ser: 0.89 mg/dL (ref 0.44–1.00)
GFR, Estimated: >60 mL/min (ref >60)
Glucose, Bld: 115 mg/dL — ABNORMAL HIGH (ref 70–99)
Potassium: 4.1 mmol/L (ref 3.5–5.1)
Sodium: 139 mmol/L (ref 135–145)
Total Bilirubin: 0.8 mg/dL (ref 0.3–1.2)
Total Protein: 7.4 g/dL (ref 6.5–8.1)

## 2020-10-13 LAB — LIPASE, BLOOD: Lipase: 39 U/L (ref 11–51)

## 2020-10-14 ENCOUNTER — Ambulatory Visit
Admission: RE | Admit: 2020-10-14 | Discharge: 2020-10-14 | Disposition: A | Discharge status: Home / Self Care | Payer: 59 | Source: Ambulatory Visit | Attending: Family Medicine | Admitting: Family Medicine

## 2020-10-14 ENCOUNTER — Telehealth: Payer: Self-pay | Admitting: Family Medicine

## 2020-10-14 DIAGNOSIS — E039 Hypothyroidism, unspecified: Secondary | ICD-10-CM

## 2020-10-14 DIAGNOSIS — R1011 Right upper quadrant pain: Secondary | ICD-10-CM | POA: Insufficient documentation

## 2020-10-14 NOTE — Telephone Encounter (Signed)
[facility] faxed refill request for the following medications:  levothyroxine (SYNTHROID) 88 MCG tablet  Last Rx: 07/10/20 QTY: 90 Refills: 0 LOV: 10/13/20 NOV: 11/02/20 Please advise. Thanks TNP

## 2020-10-15 ENCOUNTER — Telehealth: Payer: Self-pay

## 2020-10-15 MED ORDER — LEVOTHYROXINE SODIUM 88 MCG PO TABS: 88.0000 ug | ORAL_TABLET | Freq: Every day | ORAL | 1 refills | Status: DC

## 2020-10-15 NOTE — Telephone Encounter (Signed)
Pt advised.  She will call back if she decides to proceed with the referral.   Thanks,   -Mickel Baas

## 2020-10-15 NOTE — Telephone Encounter (Signed)
-----   Message from Margo Common, PA-C sent at 10/14/2020  1:36 PM EDT ----- No signs of infection, normal kidney function and normal liver enzymes. No gallstones on ultrasound, but, some small polyps in gallbladder and some fatty deposits in liver. No worrisome lesions in liver. If discomfort persists or returns after treatment with the Zofran, should consider evaluation by a general surgeon. Recommend low fat diet with increase in water intake.

## 2020-10-21 ENCOUNTER — Telehealth: Payer: Self-pay

## 2020-10-21 DIAGNOSIS — K824 Cholesterolosis of gallbladder: Secondary | ICD-10-CM

## 2020-10-21 DIAGNOSIS — R1011 Right upper quadrant pain: Secondary | ICD-10-CM

## 2020-10-21 NOTE — Telephone Encounter (Signed)
Copied from [place] [phone]. Topic: General - Other >> Oct 21, 2020  8:16 AM [place] wrote: Reason for CRM: Nurse CB for [facility], pt was seen on  5/24 by [facility], test ordered for gall stones and test came back for polyps. Pt told by [facility] that if did not get better to call back for a referral. Pt is not better at all and is in a lot of pain but trying to work. She is miserable and states something has to be done. Wants the referral as soon as possible and wants a fu call from the nurse for advice. [phone].

## 2020-10-21 NOTE — Telephone Encounter (Signed)
Do you have a preference on where to send the patient? Please advise.

## 2020-10-21 NOTE — Telephone Encounter (Signed)
Needs general surgeon referral for symptomatic gallbladder polyps.

## 2020-10-21 NOTE — Telephone Encounter (Signed)
Order has been placed.

## 2020-10-29 ENCOUNTER — Other Ambulatory Visit: Payer: Self-pay

## 2020-10-29 ENCOUNTER — Telehealth: Payer: Self-pay | Admitting: Surgery

## 2020-10-29 ENCOUNTER — Ambulatory Visit: Payer: 59 | Admitting: Surgery

## 2020-10-29 ENCOUNTER — Encounter: Payer: Self-pay | Admitting: Surgery

## 2020-10-29 ENCOUNTER — Ambulatory Visit: Payer: Self-pay | Admitting: Surgery

## 2020-10-29 VITALS — BP 123/83 | HR 68 | Temp 98.5°F | Ht 62.0 in | Wt 151.0 lb

## 2020-10-29 DIAGNOSIS — K811 Chronic cholecystitis: Secondary | ICD-10-CM

## 2020-10-29 NOTE — H&P (View-Only) (Signed)
Patient ID: Mary Miller, female   DOB: 09-Nov-1965, 55 y.o.   MRN: [id number]  Chief Complaint: Right upper quadrant pain  History of Present Illness Mary Miller is a 55 y.o. female with a long history of recurring right right upper quadrant pain.  This seems to occur frequently following meals, definite fatty food intolerance.  The pain with radiates to the back.  Prior history of small gallbladder polyps more than a decade ago, no history of stones.  No history of hepatitis, jaundice acholic stools or pancreatitis.  She has been strict with her diet to avoid the severe right upper quadrant pain which at times has lasted multiple days.  Past Medical History Past Medical History:  Diagnosis Date   Arthritis    hands   Headache    2 or 3/week   Hypothyroidism    PONV (postoperative nausea and vomiting)    Vertigo    dizzyness/ comes and goes      Past Surgical History:  Procedure Laterality Date   CESAREAN SECTION     X's 3   COLONOSCOPY WITH PROPOFOL N/A 06/30/2016   Procedure: COLONOSCOPY WITH PROPOFOL;  Surgeon: Lucilla Lame, MD;  Location: [place];  Service: Endoscopy;  Laterality: N/A;   HYSTERECTOMY ABDOMINAL WITH SALPINGECTOMY     POLYPECTOMY  06/30/2016   Procedure: POLYPECTOMY INTESTINAL;  Surgeon: Lucilla Lame, MD;  Location: [place];  Service: Endoscopy;;  Transverse colon polyp x 2.   WRIST SURGERY Left    Removed a cyst    No Known Allergies  Current Outpatient Medications  Medication Sig Dispense Refill   atorvastatin (LIPITOR) 20 MG tablet Take 1 tablet (20 mg total) by mouth daily. 90 tablet 1   levothyroxine (SYNTHROID) 88 MCG tablet Take 1 tablet (88 mcg total) by mouth daily. 90 tablet 1   traZODone (DESYREL) 50 MG tablet TAKE 2 TABLETS(100 MG) BY MOUTH AT BEDTIME AS NEEDED FOR SLEEP 60 tablet 0   No current facility-administered medications for this visit.    Family History Family History  Problem Relation Age of Onset    Hypertension Mother    Gallbladder disease Mother    Hypertension Father    Stroke Father    Gallbladder disease Father       Social History Social History   Tobacco Use   Smoking status: Some Days    Packs/day: 0.10    Years: 25.00    Pack years: 2.50    Types: Cigarettes   Smokeless tobacco: Never  Vaping Use   Vaping Use: Never used  Substance Use Topics   Alcohol use: Yes    Alcohol/week: 2.0 standard drinks    Types: 2 Shots of liquor per week    Comment: Socially   Drug use: No        Review of Systems  Constitutional: Negative.   HENT: Negative.    Eyes: Negative.   Respiratory: Negative.    Cardiovascular: Negative.   Gastrointestinal:  Positive for abdominal pain.  Genitourinary: Negative.   Skin: Negative.   Neurological: Negative.   Psychiatric/Behavioral: Negative.       Physical Exam Blood pressure 123/83, pulse 68, temperature 98.5 F (36.9 C), height 5\' 2"  (1.575 m), weight 151 lb (68.5 kg), SpO2 97 %. Last Weight  Most recent update: 10/29/2020 11:16 AM    Weight  68.5 kg (151 lb)             CONSTITUTIONAL: Well developed, and nourished, appropriately responsive  and aware without distress.   EYES: Sclera non-icteric.   EARS, NOSE, MOUTH AND THROAT: Mask worn.    Hearing is intact to voice.  NECK: Trachea is midline, and there is no jugular venous distension.  LYMPH NODES:  Lymph nodes in the neck are not enlarged. RESPIRATORY:  Lungs are clear, and breath sounds are equal bilaterally. Normal respiratory effort without pathologic use of accessory muscles. CARDIOVASCULAR: Heart is regular in rate and rhythm. GI: The abdomen is soft, nontender, and nondistended. There were no palpable masses. I did not appreciate hepatosplenomegaly. There were normal bowel sounds. MUSCULOSKELETAL:  Symmetrical muscle tone appreciated in all four extremities.    SKIN: Skin turgor is normal. No pathologic skin lesions appreciated.  NEUROLOGIC:  Motor and  sensation appear grossly normal.  Cranial nerves are grossly without defect. PSYCH:  Alert and oriented to person, place and time. Affect is appropriate for situation.  Data Reviewed I have personally reviewed what is currently available of the patient's imaging, recent labs and medical records.   Labs:  CBC Latest Ref Rng & Units 10/13/2020 03/30/2020 01/30/2019  WBC 4.0 - 10.5 K/uL 8.1 8.8 9.4  Hemoglobin 12.0 - 15.0 g/dL 15.0 13.3 14.2  Hematocrit 36.0 - 46.0 % 44.2 38.5 40.9  Platelets 150 - 400 K/uL 314 329 302   CMP Latest Ref Rng & Units 10/13/2020 03/30/2020 01/30/2019  Glucose 70 - 99 mg/dL 115(H) 134(H) 97  BUN 6 - 20 mg/dL 18 15 17   Creatinine 0.44 - 1.00 mg/dL 0.89 0.79 1.16(H)  Sodium 135 - 145 mmol/L 139 140 139  Potassium 3.5 - 5.1 mmol/L 4.1 4.1 4.3  Chloride 98 - 111 mmol/L 106 104 104  CO2 22 - 32 mmol/L 24 21 22   Calcium 8.9 - 10.3 mg/dL 9.1 9.4 9.1  Total Protein 6.5 - 8.1 g/dL 7.4 7.1 6.9  Total Bilirubin 0.3 - 1.2 mg/dL 0.8 <0.2 0.3  Alkaline Phos 38 - 126 U/L 79 112 82  AST 15 - 41 U/L 24 20 22   ALT 0 - 44 U/L 23 24 24       Imaging: Radiology review:  CLINICAL DATA:  Right upper quadrant abdominal pain and nausea over the last 3 weeks.   EXAM: ULTRASOUND ABDOMEN LIMITED RIGHT UPPER QUADRANT   COMPARISON:  Ultrasound 09/23/2008   FINDINGS: Gallbladder:   No sign of shadowing gallstones, wall thickening or surrounding fluid. No Murphy sign. The patient has several small gallbladder polyps, the largest 6 mm.   Common bile duct:   Diameter: 3.8 mm.  Normal.   Liver:   No focal lesion of significance. Mild fatty change of the liver with focal sparing adjacent to the gallbladder fossa. No worrisome focal liver lesion. Portal vein is patent on color Doppler imaging with normal direction of blood flow towards the liver.   Other: None.   IMPRESSION: No evidence of acute gallbladder disease. Small chronic gallbladder polyps. The largest 6 mm.    Increased echogenicity of the liver consistent with steatosis.     Electronically Signed   By: Nelson Chimes M.D.   On: 10/14/2020 10:26 Within last 24 hrs: No results found.  Assessment    Chronic cholecystitis Patient Active Problem List   Diagnosis Date Noted   Special screening for malignant neoplasms, colon    Benign neoplasm of transverse colon    Polyp of sigmoid colon    Carpal tunnel syndrome 04/20/2016   Clinical depression 04/20/2016   Borderline diabetes 04/20/2016   Insomnia 11/17/2014  Allergic rhinitis 09/02/2009   Anxiety 10/09/2008   Adult hypothyroidism 08/07/2008   Combined fat and carbohydrate induced hyperlipemia 08/07/2008   Current tobacco use 07/01/2008   Acid reflux 07/01/2008    Plan    Robotic cholecystectomy. This was discussed thoroughly.  Optimal plan is for robotic cholecystectomy.  Risks and benefits have been discussed with the patient which include but are not limited to anesthesia, bleeding, infection, biliary ductal injury or stenosis, other associated unanticipated injuries affiliated with laparoscopic surgery.  I believe there is the desire to proceed, interpreter utilized as needed.  Questions elicited and answered to satisfaction.  No guarantees ever expressed or implied.   Face-to-face time spent with the patient and accompanying care providers(if present) was 30 minutes, with more than 50% of the time spent counseling, educating, and coordinating care of the patient.    These notes generated with voice recognition software. I apologize for typographical errors.  Ronny Bacon M.D., FACS 10/29/2020, 11:57 AM

## 2020-10-29 NOTE — Telephone Encounter (Signed)
Incoming call from patient, she is now aware of all dates regarding her surgery and voiced understanding.

## 2020-10-29 NOTE — Patient Instructions (Signed)
You have requested to have your gallbladder removed. This will be done at [facility] with Dr. Christian Mate.  You will most likely be out of work 1-2 weeks for this surgery. You will return after your post-op appointment with a lifting restriction for approximately 4 more weeks.  You will be able to eat anything you would like to following surgery. But, start by eating a bland diet and advance this as tolerated. The Gallbladder diet is below, please go as closely by this diet as possible prior to surgery to avoid any further attacks.  Please see the (blue)pre-care form that you have been given today. Our surgery scheduler will call you to look at surgery dates and go over information.  If you have any questions, please call our office.   Laparoscopic Cholecystectomy Laparoscopic cholecystectomy is surgery to remove the gallbladder. The gallbladder is located in the upper right part of the abdomen, behind the liver. It is a storage sac for bile, which is produced in the liver. Bile aids in the digestion and absorption of fats. Cholecystectomy is often done for inflammation of the gallbladder (cholecystitis). This condition is usually caused by a buildup of gallstones (cholelithiasis) in the gallbladder. Gallstones can block the flow of bile, and that can result in inflammation and pain. In severe cases, emergency surgery may be required. If emergency surgery is not required, you will have time to prepare for the procedure. Laparoscopic surgery is an alternative to open surgery. Laparoscopic surgery has a shorter recovery time. Your common bile duct may also need to be examined during the procedure. If stones are found in the common bile duct, they may be removed. LET [facility] CARE PROVIDER KNOW ABOUT: Any allergies you have. All medicines you are taking, including vitamins, herbs, eye drops, creams, and over-the-counter medicines. Previous problems you or members of your family have had with  the use of anesthetics. Any blood disorders you have. Previous surgeries you have had.  Any medical conditions you have. RISKS AND COMPLICATIONS Generally, this is a safe procedure. However, problems may occur, including: Infection. Bleeding. Allergic reactions to medicines. Damage to other structures or organs. A stone remaining in the common bile duct. A bile leak from the cyst duct that is clipped when your gallbladder is removed. The need to convert to open surgery, which requires a larger incision in the abdomen. This may be necessary if your surgeon thinks that it is not safe to continue with a laparoscopic procedure. BEFORE THE PROCEDURE Ask your health care provider about: Changing or stopping your regular medicines. This is especially important if you are taking diabetes medicines or blood thinners. Taking medicines such as aspirin and ibuprofen. These medicines can thin your blood. Do not take these medicines before your procedure if your health care provider instructs you not to. Follow instructions from your health care provider about eating or drinking restrictions. Let your health care provider know if you develop a cold or an infection before surgery. Plan to have someone take you home after the procedure. Ask your health care provider how your surgical site will be marked or identified. You may be given antibiotic medicine to help prevent infection. PROCEDURE To reduce your risk of infection: Your health care team will wash or sanitize their hands. Your skin will be washed with soap. An IV tube may be inserted into one of your veins. You will be given a medicine to make you fall asleep (general anesthetic). A breathing tube will be placed in  your mouth. The surgeon will make several small cuts (incisions) in your abdomen. A thin, lighted tube (laparoscope) that has a tiny camera on the end will be inserted through one of the small incisions. The camera on the  laparoscope will send a picture to a TV screen (monitor) in the operating room. This will give the surgeon a good view inside your abdomen. A gas will be pumped into your abdomen. This will expand your abdomen to give the surgeon more room to perform the surgery. Other tools that are needed for the procedure will be inserted through the other incisions. The gallbladder will be removed through one of the incisions. After your gallbladder has been removed, the incisions will be closed with stitches (sutures), staples, or skin glue. Your incisions may be covered with a bandage (dressing). The procedure may vary among health care providers and hospitals. AFTER THE PROCEDURE Your blood pressure, heart rate, breathing rate, and blood oxygen level will be monitored often until the medicines you were given have worn off. You will be given medicines as needed to control your pain.   This information is not intended to replace advice given to you by your health care provider. Make sure you discuss any questions you have with your health care provider.   Document Released: 05/09/2005 Document Revised: 01/28/2015 Document Reviewed: 12/19/2012 Elsevier Interactive Patient Education 2016 [employer] Diet for Gallbladder Conditions A low-fat diet can be helpful if you have pancreatitis or a gallbladder condition. With these conditions, your pancreas and gallbladder have trouble digesting fats. A healthy eating plan with less fat will help rest your pancreas and gallbladder and reduce your symptoms. WHAT DO I NEED TO KNOW ABOUT THIS DIET? Eat a low-fat diet. Reduce your fat intake to less than 20-30% of your total daily calories. This is less than 50-60 g of fat per day. Remember that you need some fat in your diet. Ask your dietician what your daily goal should be. Choose nonfat and low-fat healthy foods. Look for the words "nonfat," "low fat," or "fat free." As a guide, look on the label and  choose foods with less than 3 g of fat per serving. Eat only one serving. Avoid alcohol. Do not smoke. If you need help quitting, talk with your health care provider. Eat small frequent meals instead of three large heavy meals. WHAT FOODS CAN I EAT? Grains Include healthy grains and starches such as potatoes, wheat bread, fiber-rich cereal, and brown rice. Choose whole grain options whenever possible. In adults, whole grains should account for 45-65% of your daily calories.  Fruits and Vegetables Eat plenty of fruits and vegetables. Fresh fruits and vegetables add fiber to your diet. Meats and Other Protein Sources Eat lean meat such as chicken and pork. Trim any fat off of meat before cooking it. Eggs, fish, and beans are other sources of protein. In adults, these foods should account for 10-35% of your daily calories. Dairy Choose low-fat milk and dairy options. Dairy includes fat and protein, as well as calcium.  Fats and Oils Limit high-fat foods such as fried foods, sweets, baked goods, sugary drinks.  Other Creamy sauces and condiments, such as mayonnaise, can add extra fat. Think about whether or not you need to use them, or use smaller amounts or low fat options. WHAT FOODS ARE NOT RECOMMENDED? High fat foods, such as: [employer]. Ice cream. [place] toast. Sweet rolls. Pizza. Cheese bread. Foods covered with batter, butter, creamy sauces, or  cheese. Fried foods. Sugary drinks and desserts. Foods that cause gas or bloating   This information is not intended to replace advice given to you by your health care provider. Make sure you discuss any questions you have with your health care provider.   Document Released: 05/14/2013 Document Reviewed: 05/14/2013 Elsevier Interactive Patient Education [employer].

## 2020-10-29 NOTE — Telephone Encounter (Signed)
Outgoing call is made, left message for patient to call.  Please advise patient of Pre-Admission date/time, COVID Testing date and Surgery date.  Surgery Date: 11/04/20 Preadmission Testing Date: 11/03/20 (phone 8a-1p) Covid Testing Date: Not needed.    Also patient to call at [phone], between 1-3:00pm the day before surgery, to find out what time to arrive for surgery.

## 2020-10-29 NOTE — Progress Notes (Signed)
Patient ID: Mary Miller, female   DOB: 1965-11-15, 55 y.o.   MRN: [id number]  Chief Complaint: Right upper quadrant pain  History of Present Illness Mary Miller is a 55 y.o. female with a long history of recurring right right upper quadrant pain.  This seems to occur frequently following meals, definite fatty food intolerance.  The pain with radiates to the back.  Prior history of small gallbladder polyps more than a decade ago, no history of stones.  No history of hepatitis, jaundice acholic stools or pancreatitis.  She has been strict with her diet to avoid the severe right upper quadrant pain which at times has lasted multiple days.  Past Medical History Past Medical History:  Diagnosis Date   Arthritis    hands   Headache    2 or 3/week   Hypothyroidism    PONV (postoperative nausea and vomiting)    Vertigo    dizzyness/ comes and goes      Past Surgical History:  Procedure Laterality Date   CESAREAN SECTION     X's 3   COLONOSCOPY WITH PROPOFOL N/A 06/30/2016   Procedure: COLONOSCOPY WITH PROPOFOL;  Surgeon: Lucilla Lame, MD;  Location: [place];  Service: Endoscopy;  Laterality: N/A;   HYSTERECTOMY ABDOMINAL WITH SALPINGECTOMY     POLYPECTOMY  06/30/2016   Procedure: POLYPECTOMY INTESTINAL;  Surgeon: Lucilla Lame, MD;  Location: [place];  Service: Endoscopy;;  Transverse colon polyp x 2.   WRIST SURGERY Left    Removed a cyst    No Known Allergies  Current Outpatient Medications  Medication Sig Dispense Refill   atorvastatin (LIPITOR) 20 MG tablet Take 1 tablet (20 mg total) by mouth daily. 90 tablet 1   levothyroxine (SYNTHROID) 88 MCG tablet Take 1 tablet (88 mcg total) by mouth daily. 90 tablet 1   traZODone (DESYREL) 50 MG tablet TAKE 2 TABLETS(100 MG) BY MOUTH AT BEDTIME AS NEEDED FOR SLEEP 60 tablet 0   No current facility-administered medications for this visit.    Family History Family History  Problem Relation Age of Onset    Hypertension Mother    Gallbladder disease Mother    Hypertension Father    Stroke Father    Gallbladder disease Father       Social History Social History   Tobacco Use   Smoking status: Some Days    Packs/day: 0.10    Years: 25.00    Pack years: 2.50    Types: Cigarettes   Smokeless tobacco: Never  Vaping Use   Vaping Use: Never used  Substance Use Topics   Alcohol use: Yes    Alcohol/week: 2.0 standard drinks    Types: 2 Shots of liquor per week    Comment: Socially   Drug use: No        Review of Systems  Constitutional: Negative.   HENT: Negative.    Eyes: Negative.   Respiratory: Negative.    Cardiovascular: Negative.   Gastrointestinal:  Positive for abdominal pain.  Genitourinary: Negative.   Skin: Negative.   Neurological: Negative.   Psychiatric/Behavioral: Negative.       Physical Exam Blood pressure 123/83, pulse 68, temperature 98.5 F (36.9 C), height 5\' 2"  (1.575 m), weight 151 lb (68.5 kg), SpO2 97 %. Last Weight  Most recent update: 10/29/2020 11:16 AM    Weight  68.5 kg (151 lb)             CONSTITUTIONAL: Well developed, and nourished, appropriately responsive  and aware without distress.   EYES: Sclera non-icteric.   EARS, NOSE, MOUTH AND THROAT: Mask worn.    Hearing is intact to voice.  NECK: Trachea is midline, and there is no jugular venous distension.  LYMPH NODES:  Lymph nodes in the neck are not enlarged. RESPIRATORY:  Lungs are clear, and breath sounds are equal bilaterally. Normal respiratory effort without pathologic use of accessory muscles. CARDIOVASCULAR: Heart is regular in rate and rhythm. GI: The abdomen is soft, nontender, and nondistended. There were no palpable masses. I did not appreciate hepatosplenomegaly. There were normal bowel sounds. MUSCULOSKELETAL:  Symmetrical muscle tone appreciated in all four extremities.    SKIN: Skin turgor is normal. No pathologic skin lesions appreciated.  NEUROLOGIC:  Motor and  sensation appear grossly normal.  Cranial nerves are grossly without defect. PSYCH:  Alert and oriented to person, place and time. Affect is appropriate for situation.  Data Reviewed I have personally reviewed what is currently available of the patient's imaging, recent labs and medical records.   Labs:  CBC Latest Ref Rng & Units 10/13/2020 03/30/2020 01/30/2019  WBC 4.0 - 10.5 K/uL 8.1 8.8 9.4  Hemoglobin 12.0 - 15.0 g/dL 15.0 13.3 14.2  Hematocrit 36.0 - 46.0 % 44.2 38.5 40.9  Platelets 150 - 400 K/uL 314 329 302   CMP Latest Ref Rng & Units 10/13/2020 03/30/2020 01/30/2019  Glucose 70 - 99 mg/dL 115(H) 134(H) 97  BUN 6 - 20 mg/dL 18 15 17   Creatinine 0.44 - 1.00 mg/dL 0.89 0.79 1.16(H)  Sodium 135 - 145 mmol/L 139 140 139  Potassium 3.5 - 5.1 mmol/L 4.1 4.1 4.3  Chloride 98 - 111 mmol/L 106 104 104  CO2 22 - 32 mmol/L 24 21 22   Calcium 8.9 - 10.3 mg/dL 9.1 9.4 9.1  Total Protein 6.5 - 8.1 g/dL 7.4 7.1 6.9  Total Bilirubin 0.3 - 1.2 mg/dL 0.8 <0.2 0.3  Alkaline Phos 38 - 126 U/L 79 112 82  AST 15 - 41 U/L 24 20 22   ALT 0 - 44 U/L 23 24 24       Imaging: Radiology review:  CLINICAL DATA:  Right upper quadrant abdominal pain and nausea over the last 3 weeks.   EXAM: ULTRASOUND ABDOMEN LIMITED RIGHT UPPER QUADRANT   COMPARISON:  Ultrasound 09/23/2008   FINDINGS: Gallbladder:   No sign of shadowing gallstones, wall thickening or surrounding fluid. No Murphy sign. The patient has several small gallbladder polyps, the largest 6 mm.   Common bile duct:   Diameter: 3.8 mm.  Normal.   Liver:   No focal lesion of significance. Mild fatty change of the liver with focal sparing adjacent to the gallbladder fossa. No worrisome focal liver lesion. Portal vein is patent on color Doppler imaging with normal direction of blood flow towards the liver.   Other: None.   IMPRESSION: No evidence of acute gallbladder disease. Small chronic gallbladder polyps. The largest 6 mm.    Increased echogenicity of the liver consistent with steatosis.     Electronically Signed   By: Nelson Chimes M.D.   On: 10/14/2020 10:26 Within last 24 hrs: No results found.  Assessment    Chronic cholecystitis Patient Active Problem List   Diagnosis Date Noted   Special screening for malignant neoplasms, colon    Benign neoplasm of transverse colon    Polyp of sigmoid colon    Carpal tunnel syndrome 04/20/2016   Clinical depression 04/20/2016   Borderline diabetes 04/20/2016   Insomnia 11/17/2014  Allergic rhinitis 09/02/2009   Anxiety 10/09/2008   Adult hypothyroidism 08/07/2008   Combined fat and carbohydrate induced hyperlipemia 08/07/2008   Current tobacco use 07/01/2008   Acid reflux 07/01/2008    Plan    Robotic cholecystectomy. This was discussed thoroughly.  Optimal plan is for robotic cholecystectomy.  Risks and benefits have been discussed with the patient which include but are not limited to anesthesia, bleeding, infection, biliary ductal injury or stenosis, other associated unanticipated injuries affiliated with laparoscopic surgery.  I believe there is the desire to proceed, interpreter utilized as needed.  Questions elicited and answered to satisfaction.  No guarantees ever expressed or implied.   Face-to-face time spent with the patient and accompanying care providers(if present) was 30 minutes, with more than 50% of the time spent counseling, educating, and coordinating care of the patient.    These notes generated with voice recognition software. I apologize for typographical errors.  Ronny Bacon M.D., FACS 10/29/2020, 11:57 AM

## 2020-11-02 ENCOUNTER — Ambulatory Visit (INDEPENDENT_AMBULATORY_CARE_PROVIDER_SITE_OTHER): Payer: 59 | Admitting: Family Medicine

## 2020-11-02 ENCOUNTER — Encounter: Payer: Self-pay | Admitting: Family Medicine

## 2020-11-02 ENCOUNTER — Encounter: Payer: Self-pay | Admitting: Physician Assistant

## 2020-11-02 ENCOUNTER — Other Ambulatory Visit: Payer: Self-pay

## 2020-11-02 VITALS — BP 146/81 | HR 69 | Temp 98.1°F | Resp 16 | Ht 62.0 in | Wt 152.8 lb

## 2020-11-02 DIAGNOSIS — Z1231 Encounter for screening mammogram for malignant neoplasm of breast: Secondary | ICD-10-CM

## 2020-11-02 DIAGNOSIS — E7800 Pure hypercholesterolemia, unspecified: Secondary | ICD-10-CM

## 2020-11-02 DIAGNOSIS — Z1159 Encounter for screening for other viral diseases: Secondary | ICD-10-CM

## 2020-11-02 DIAGNOSIS — G4700 Insomnia, unspecified: Secondary | ICD-10-CM

## 2020-11-02 DIAGNOSIS — Z0000 Encounter for general adult medical examination without abnormal findings: Secondary | ICD-10-CM | POA: Diagnosis not present

## 2020-11-02 DIAGNOSIS — R739 Hyperglycemia, unspecified: Secondary | ICD-10-CM

## 2020-11-02 DIAGNOSIS — E039 Hypothyroidism, unspecified: Secondary | ICD-10-CM

## 2020-11-02 DIAGNOSIS — F32A Depression, unspecified: Secondary | ICD-10-CM

## 2020-11-02 DIAGNOSIS — F419 Anxiety disorder, unspecified: Secondary | ICD-10-CM

## 2020-11-02 DIAGNOSIS — Z720 Tobacco use: Secondary | ICD-10-CM

## 2020-11-02 DIAGNOSIS — R1011 Right upper quadrant pain: Secondary | ICD-10-CM

## 2020-11-02 MED ORDER — TRAZODONE HCL 100 MG PO TABS: ORAL_TABLET | ORAL | 3 refills | Status: DC

## 2020-11-02 NOTE — Patient Instructions (Addendum)
Please call [facility] for appt [phone]  Preventive Care 60-56 Years Old, Female Preventive care refers to lifestyle choices and visits with your health care provider that can promote health and wellness. This includes: A yearly physical exam. This is also called an annual wellness visit. Regular dental and eye exams. Immunizations. Screening for certain conditions. Healthy lifestyle choices, such as: Eating a healthy diet. Getting regular exercise. Not using drugs or products that contain nicotine and tobacco. Limiting alcohol use. What can I expect for my preventive care visit? Physical exam Your health care provider will check your: Height and weight. These may be used to calculate your BMI (body mass index). BMI is a measurement that tells if you are at a healthy weight. Heart rate and blood pressure. Body temperature. Skin for abnormal spots. Counseling Your health care provider may ask you questions about your: Past medical problems. Family's medical history. Alcohol, tobacco, and drug use. Emotional well-being. Home life and relationship well-being. Sexual activity. Diet, exercise, and sleep habits. Work and work Statistician. Access to firearms. Method of birth control. Menstrual cycle. Pregnancy history. What immunizations do I need?  Vaccines are usually given at various ages, according to a schedule. Your health care provider will recommend vaccines for you based on your age, medicalhistory, and lifestyle or other factors, such as travel or where you work. What tests do I need? Blood tests Lipid and cholesterol levels. These may be checked every 5 years, or more often if you are over 27 years old. Hepatitis C test. Hepatitis B test. Screening Lung cancer screening. You may have this screening every year starting at age 51 if you have a 30-pack-year history of smoking and currently smoke or have quit within the past 15 years. Colorectal  cancer screening. All adults should have this screening starting at age 51 and continuing until age 19. Your health care provider may recommend screening at age 31 if you are at increased risk. You will have tests every 1-10 years, depending on your results and the type of screening test. Diabetes screening. This is done by checking your blood sugar (glucose) after you have not eaten for a while (fasting). You may have this done every 1-3 years. Mammogram. This may be done every 1-2 years. Talk with your health care provider about when you should start having regular mammograms. This may depend on whether you have a family history of breast cancer. BRCA-related cancer screening. This may be done if you have a family history of breast, ovarian, tubal, or peritoneal cancers. Pelvic exam and Pap test. This may be done every 3 years starting at age 56. Starting at age 42, this may be done every 5 years if you have a Pap test in combination with an HPV test. Other tests STD (sexually transmitted disease) testing, if you are at risk. Bone density scan. This is done to screen for osteoporosis. You may have this scan if you are at high risk for osteoporosis. Talk with your health care provider about your test results, treatment options,and if necessary, the need for more tests. Follow these instructions at home: Eating and drinking  Eat a diet that includes fresh fruits and vegetables, whole grains, lean protein, and low-fat dairy products. Take vitamin and mineral supplements as recommended by your health care provider. Do not drink alcohol if: Your health care provider tells you not to drink. You are pregnant, may be pregnant, or are planning to become pregnant. If you drink alcohol: Limit  how much you have to 0-1 drink a day. Be aware of how much alcohol is in your drink. In the [place] one drink equals one 12 oz bottle of beer (355 mL), one 5 oz glass of wine (148 mL), or one 1 oz glass of  hard liquor (44 mL).  Lifestyle Take daily care of your teeth and gums. Brush your teeth every morning and night with fluoride toothpaste. Floss one time each day. Stay active. Exercise for at least 30 minutes 5 or more days each week. Do not use any products that contain nicotine or tobacco, such as cigarettes, e-cigarettes, and chewing tobacco. If you need help quitting, ask your health care provider. Do not use drugs. If you are sexually active, practice safe sex. Use a condom or other form of protection to prevent STIs (sexually transmitted infections). If you do not wish to become pregnant, use a form of birth control. If you plan to become pregnant, see your health care provider for a prepregnancy visit. If told by your health care provider, take low-dose aspirin daily starting at age 66. Find healthy ways to cope with stress, such as: Meditation, yoga, or listening to music. Journaling. Talking to a trusted person. Spending time with friends and family. Safety Always wear your seat belt while driving or riding in a vehicle. Do not drive: If you have been drinking alcohol. Do not ride with someone who has been drinking. When you are tired or distracted. While texting. Wear a helmet and other protective equipment during sports activities. If you have firearms in your house, make sure you follow all gun safety procedures. What's next? Visit your health care provider once a year for an annual wellness visit. Ask your health care provider how often you should have your eyes and teeth checked. Stay up to date on all vaccines. This information is not intended to replace advice given to you by your health care provider. Make sure you discuss any questions you have with your healthcare provider. Document Revised: 02/11/2020 Document Reviewed: 01/18/2018 Elsevier Patient Education  2022 [employer].

## 2020-11-02 NOTE — Progress Notes (Signed)
Complete physical exam   Patient: Mary Miller   DOB: [birth year]   55 y.o. Female  MRN: [id number] Visit Date: 11/02/2020  Today's healthcare provider: Vernie Murders, PA-C   Chief Complaint  Patient presents with   Annual Exam   Subjective    Mary Miller is a 55 y.o. female who presents today for a complete physical exam.  She reports consuming a general diet. Home exercise routine includes walking 3 hrs per week. She generally feels fairly well. She reports sleeping fairly well. She does not have additional problems to discuss today.  HPI  Patient will have cholecystectomy surgery on 11/04/2020.   Past Medical History:  Diagnosis Date   Arthritis    hands   Headache    2 or 3/week   Hypothyroidism    PONV (postoperative nausea and vomiting)    Vertigo    dizzyness/ comes and goes   Past Surgical History:  Procedure Laterality Date   CESAREAN SECTION     X's 3   COLONOSCOPY WITH PROPOFOL N/A 06/30/2016   Procedure: COLONOSCOPY WITH PROPOFOL;  Surgeon: Lucilla Lame, MD;  Location: [facility];  Service: Endoscopy;  Laterality: N/A;   HYSTERECTOMY ABDOMINAL WITH SALPINGECTOMY     POLYPECTOMY  06/30/2016   Procedure: POLYPECTOMY INTESTINAL;  Surgeon: Lucilla Lame, MD;  Location: [facility];  Service: Endoscopy;;  Transverse colon polyp x 2.   WRIST SURGERY Left    Removed a cyst   Social History   Socioeconomic History   Marital status: Married    Spouse name: Not on file   Number of children: Not on file   Years of education: Not on file   Highest education level: Not on file  Occupational History   Not on file  Tobacco Use   Smoking status: Some Days    Packs/day: 0.10    Years: 25.00    Pack years: 2.50    Types: Cigarettes   Smokeless tobacco: Never  Vaping Use   Vaping Use: Never used  Substance and Sexual Activity   Alcohol use: Yes    Alcohol/week: 2.0 standard drinks    Types: 2 Shots of liquor per week    Comment:  Socially   Drug use: No   Sexual activity: Not on file  Other Topics Concern   Not on file  Social History Narrative   Not on file   Social Determinants of Health   Financial Resource Strain: Not on file  Food Insecurity: Not on file  Transportation Needs: Not on file  Physical Activity: Not on file  Stress: Not on file  Social Connections: Not on file  Intimate Partner Violence: Not on file   Family Status  Relation Name Status   Mother  Alive   Father  Alive   Sister  Alive   Sister  Alive   Brother  Alive   Daughter  Alive   Son  Alive   Son  Alive   Family History  Problem Relation Age of Onset   Gallbladder disease Mother    Hypertension Father    Stroke Father    Gallbladder disease Father    No Known Allergies  Patient Care Team: Jacayla Nordell, Vickki Muff, PA-C as PCP - General (Family Medicine)   Medications: Outpatient Medications Prior to Visit  Medication Sig   atorvastatin (LIPITOR) 20 MG tablet Take 1 tablet (20 mg total) by mouth daily.   levothyroxine (SYNTHROID) 88 MCG tablet Take 1  tablet (88 mcg total) by mouth daily.   traZODone (DESYREL) 50 MG tablet TAKE 2 TABLETS(100 MG) BY MOUTH AT BEDTIME AS NEEDED FOR SLEEP (Patient taking differently: Take 100 mg by mouth at bedtime.)   [DISCONTINUED] atorvastatin (LIPITOR) 10 MG tablet Take 10 mg by mouth daily.   No facility-administered medications prior to visit.    Review of Systems  Constitutional: Negative.   HENT: Negative.    Eyes: Negative.   Respiratory: Negative.    Cardiovascular: Negative.   Gastrointestinal:  Positive for abdominal pain.  Endocrine: Negative.   Genitourinary: Negative.   Musculoskeletal: Negative.   Skin: Negative.   Allergic/Immunologic: Negative.   Neurological: Negative.   Hematological: Negative.   Psychiatric/Behavioral: Negative.     Last CBC Lab Results  Component Value Date   WBC 8.1 10/13/2020   HGB 15.0 10/13/2020   HCT 44.2 10/13/2020   MCV 94.8  10/13/2020   MCH 32.2 10/13/2020   RDW 13.2 10/13/2020   PLT 314 59/16/3846   Last metabolic panel Lab Results  Component Value Date   GLUCOSE 115 (H) 10/13/2020   NA 139 10/13/2020   K 4.1 10/13/2020   CL 106 10/13/2020   CO2 24 10/13/2020   BUN 18 10/13/2020   CREATININE 0.89 10/13/2020   GFRNONAA >60 10/13/2020   GFRAA 97 03/30/2020   CALCIUM 9.1 10/13/2020   PROT 7.4 10/13/2020   ALBUMIN 4.4 10/13/2020   LABGLOB 2.7 03/30/2020   AGRATIO 1.6 03/30/2020   BILITOT 0.8 10/13/2020   ALKPHOS 79 10/13/2020   AST 24 10/13/2020   ALT 23 10/13/2020   ANIONGAP 9 10/13/2020   Last lipids Lab Results  Component Value Date   CHOL 256 (H) 03/30/2020   HDL 39 (L) 03/30/2020   LDLCALC 111 (H) 03/30/2020   LDLDIRECT 176 (H) 01/30/2019   TRIG 608 (HH) 03/30/2020   CHOLHDL 6.6 (H) 03/30/2020   Last hemoglobin A1c Lab Results  Component Value Date   HGBA1C 6.0 (H) 03/30/2020   Last thyroid functions Lab Results  Component Value Date   TSH 1.240 03/30/2020   Last vitamin D No results found for: 25OHVITD2, 25OHVITD3, VD25OH Last vitamin B12 and Folate No results found for: VITAMINB12, FOLATE    Objective    BP (!) 146/81 (BP Location: Right Arm, Patient Position: Sitting, Cuff Size: Large)   Pulse 69   Temp 98.1 F (36.7 C) (Oral)   Resp 16   Ht 5\' 2"  (1.575 m)   Wt 152 lb 12.8 oz (69.3 kg)   SpO2 99%   BMI 27.95 kg/m  BP Readings from Last 3 Encounters:  11/02/20 (!) 146/81  10/29/20 123/83  10/13/20 (!) 156/85   Wt Readings from Last 3 Encounters:  11/02/20 152 lb 12.8 oz (69.3 kg)  10/29/20 151 lb (68.5 kg)  10/13/20 153 lb (69.4 kg)      Physical Exam Constitutional:      Appearance: Normal appearance. She is well-developed.  HENT:     Head: Normocephalic and atraumatic.     Right Ear: Tympanic membrane, ear canal and external ear normal.     Left Ear: Tympanic membrane, ear canal and external ear normal.     Nose: Nose normal.     Mouth/Throat:      Pharynx: Uvula midline.  Eyes:     General: Lids are normal.     Conjunctiva/sclera: Conjunctivae normal.     Pupils: Pupils are equal, round, and reactive to light.  Neck:  Thyroid: No thyroid mass or thyromegaly.     Vascular: No carotid bruit.     Trachea: Trachea normal.  Cardiovascular:     Rate and Rhythm: Normal rate and regular rhythm.     Heart sounds: Normal heart sounds.  Pulmonary:     Effort: Pulmonary effort is normal.     Breath sounds: Normal breath sounds.  Abdominal:     General: Bowel sounds are normal.     Palpations: Abdomen is soft.     Tenderness: There is abdominal tenderness.  Musculoskeletal:        General: Normal range of motion.     Cervical back: Normal range of motion and neck supple.  Lymphadenopathy:     Cervical: No cervical adenopathy.  Skin:    General: Skin is warm and dry.  Neurological:     Mental Status: She is alert and oriented to person, place, and time.     Cranial Nerves: No cranial nerve deficit.  Psychiatric:        Mood and Affect: Mood normal.        Speech: Speech normal.        Behavior: Behavior normal.        Thought Content: Thought content normal.        Judgment: Judgment normal.      Last depression screening scores PHQ 2/9 Scores 11/02/2020 03/30/2020 01/30/2019  PHQ - 2 Score 0 0 0  PHQ- 9 Score 1 1 3    Last fall risk screening Fall Risk  11/02/2020  Falls in the past year? 0  Number falls in past yr: 0  Injury with Fall? 0  Risk for fall due to : No Fall Risks  Follow up Falls evaluation completed   Last Audit-C alcohol use screening Alcohol Use Disorder Test (AUDIT) 11/02/2020  1. How often do you have a drink containing alcohol? 2  2. How many drinks containing alcohol do you have on a typical day when you are drinking? 0  3. How often do you have six or more drinks on one occasion? 0  AUDIT-C Score 2   A score of 3 or more in women, and 4 or more in men indicates increased risk for alcohol  abuse, EXCEPT if all of the points are from question 1   No results found for any visits on 11/02/20.  Assessment & Plan    Routine Health Maintenance and Physical Exam  Exercise Activities and Dietary recommendations  Goals   Maintain low fat diet to help lose weight and control BP.     Immunization History  Administered Date(s) Administered   Influenza,inj,Quad PF,6+ Mos 03/08/2018, 01/30/2019   Tdap 01/19/2011    Health Maintenance  Topic Date Due   COVID-19 Vaccine (1) Never done   Pneumococcal Vaccine 13-7 Years old (1 - PCV) Never done   Hepatitis C Screening  Never done   MAMMOGRAM  Never done   Zoster Vaccines- Shingrix (1 of 2) Never done   INFLUENZA VACCINE  12/21/2020   TETANUS/TDAP  01/18/2021   COLONOSCOPY (Pts 45-69yrs Insurance coverage will need to be confirmed)  06/30/2026   HIV Screening  Completed   HPV VACCINES  Aged Out    Discussed health benefits of physical activity, and encouraged her to engage in regular exercise appropriate for her age and condition.  1. Annual physical exam General health stable. Still having some RUQ pain with history of chronic cholecystitis with polyps diagnosed 10-14-20. Scheduled for GB surgery on  11-04-20. Will get fasting labs for CPE after surgery accomplished. - CBC with Differential/Platelet - Comprehensive metabolic panel - Hemoglobin A1c - Lipid Panel With LDL/HDL Ratio - TSH - Hepatitis C antibody  2. Adult hypothyroidism Continue Levothyroxine 88 mcg qd. No thyromegaly or nodules. Recheck labs after surgery. - CBC with Differential/Platelet - Comprehensive metabolic panel - TSH  3. Hyperglycemia No polyuria, polydipsia or polyphagia. No vision changes or peripheral neuropathy symptoms. - Hemoglobin A1c  4. Hypercholesterolemia Tolerating the Atorvastatin 20 mg qd. Will postpone labs until after GB surgery. - Comprehensive metabolic panel - Lipid Panel With LDL/HDL Ratio  5. Anxiety A little more  anxious today with schedule for GB surgery in 2 days. Continue Trazodone and get follow up labs. - CBC with Differential/Platelet - Comprehensive metabolic panel - TSH - traZODone (DESYREL) 100 MG tablet; TAKE 1 TABLET BY MOUTH AT BEDTIME AS NEEDED FOR SLEEP  Dispense: 90 tablet; Refill: 3  6. Depression, unspecified depression type No suicidal ideation. Good motivation and mood with continued use of Trazodone at bedtime. Refilled prescription and recheck follow up labs. - CBC with Differential/Platelet - Comprehensive metabolic panel - TSH - traZODone (DESYREL) 100 MG tablet; TAKE 1 TABLET BY MOUTH AT BEDTIME AS NEEDED FOR SLEEP  Dispense: 90 tablet; Refill: 3  7. Encounter for hepatitis C screening test for low risk patient - Hepatitis C antibody  8. Encounter for screening mammogram for malignant neoplasm of breast - MM 3D SCREEN BREAST BILATERAL  9. Current tobacco use Still smoking less than and 1/2 ppd. Working on cessation.  10. Insomnia, unspecified type Trazodone works very well to help with insomnia. No daytime somnolence. - traZODone (DESYREL) 100 MG tablet; TAKE 1 TABLET BY MOUTH AT BEDTIME AS NEEDED FOR SLEEP  Dispense: 90 tablet; Refill: 3  11. RUQ abdominal pain Scheduled for cholecystectomy on 11-04-20 for chronic cholecystitis with polyps by surgeon (Dr. Christian Mate). Medically cleared for surgery. - CBC with Differential/Platelet - Comprehensive metabolic panel   No follow-ups on file.     I, Cedrica Brune, PA-C, have reviewed all documentation for this visit. The documentation on 11/02/20 for the exam, diagnosis, procedures, and orders are all accurate and complete.    Vernie Murders, PA-C  [employer] [phone] (phone) [phone] (fax)  [place]

## 2020-11-03 ENCOUNTER — Other Ambulatory Visit
Admission: RE | Admit: 2020-11-03 | Discharge: 2020-11-03 | Disposition: A | Discharge status: Home / Self Care | Payer: 59 | Source: Ambulatory Visit | Attending: Surgery | Admitting: Surgery

## 2020-11-03 ENCOUNTER — Other Ambulatory Visit: Payer: Self-pay

## 2020-11-03 HISTORY — DX: Family history of other specified conditions: Z84.89

## 2020-11-03 NOTE — Patient Instructions (Addendum)
Your procedure is scheduled on:  Wednesday, June 15 Report to the Registration Desk on the 1st floor of the [facility]. To find out your arrival time, please call [phone] between 1PM - 3PM on: Tuesday, June 14  REMEMBER: Instructions that are not followed completely may result in serious medical risk, up to and including death; or upon the discretion of your surgeon and anesthesiologist your surgery may need to be rescheduled.  Do not eat food after midnight the night before surgery.  No gum chewing, lozengers or hard candies.  You may however, drink CLEAR liquids up to 2 hours before you are scheduled to arrive for your surgery. Do not drink anything within 2 hours of your scheduled arrival time.  Clear liquids include: - water  - apple juice without pulp - gatorade (not RED, PURPLE, OR BLUE) - black coffee or tea (Do NOT add milk or creamers to the coffee or tea) Do NOT drink anything that is not on this list.  TAKE THESE MEDICATIONS THE MORNING OF SURGERY WITH A SIP OF WATER:   Atorvastatin Levothyroxine  One week prior to surgery: Stop Anti-inflammatories (NSAIDS) such as Advil, Aleve, Ibuprofen, Motrin, Naproxen, Naprosyn and Aspirin based products such as Excedrin, Goodys Powder, BC Powder. Stop ANY OVER THE COUNTER supplements until after surgery. You may however, continue to take Tylenol if needed for pain up until the day of surgery.  No Alcohol for 24 hours before or after surgery.  No Smoking including e-cigarettes for 24 hours prior to surgery.  No chewable tobacco products for at least 6 hours prior to surgery.  No nicotine patches on the day of surgery.  Do not use any "recreational" drugs for at least a week prior to your surgery.  Please be advised that the combination of cocaine and anesthesia may have negative outcomes, up to and including death. If you test positive for cocaine, your surgery will be cancelled.  On the morning of surgery brush your  teeth with toothpaste and water, you may rinse your mouth with mouthwash if you wish. Do not swallow any toothpaste or mouthwash.  Do not wear jewelry, make-up, hairpins, clips or nail polish.  Do not wear lotions, powders, or perfumes.   Do not shave body from the neck down 48 hours prior to surgery just in case you cut yourself which could leave a site for infection.  Also, freshly shaved skin may become irritated if using the CHG soap.  Do not bring valuables to the hospital. [facility] is not responsible for any missing/lost belongings or valuables.   Use CHG Soap as directed on instruction sheet or shower using antibacterial soap if CHG soap not available.  Notify your doctor if there is any change in your medical condition (cold, fever, infection).  Wear comfortable clothing (specific to your surgery type) to the hospital.  After surgery, you can help prevent lung complications by doing breathing exercises.  Take deep breaths and cough every 1-2 hours. Your doctor may order a device called an Incentive Spirometer to help you take deep breaths. When coughing or sneezing, hold a pillow firmly against your incision with both hands. This is called "splinting." Doing this helps protect your incision. It also decreases belly discomfort.  If you are being discharged the day of surgery, you will not be allowed to drive home. You will need a responsible adult (18 years or older) to drive you home and stay with you that night.   If you are  taking public transportation, you will need to have a responsible adult (18 years or older) with you. Please confirm with your physician that it is acceptable to use public transportation.   Please call the [facility] at [phone] if you have any questions about these instructions.  Surgery Visitation Policy:  Patients undergoing a surgery or procedure may have one family member or support person with them as long as that person  is not COVID-19 positive or experiencing its symptoms.  That person may remain in the waiting area during the procedure.

## 2020-11-04 ENCOUNTER — Ambulatory Visit: Payer: 59 | Admitting: Registered Nurse

## 2020-11-04 ENCOUNTER — Other Ambulatory Visit: Payer: Self-pay

## 2020-11-04 ENCOUNTER — Encounter: Payer: Self-pay | Admitting: Surgery

## 2020-11-04 ENCOUNTER — Ambulatory Visit
Admission: RE | Admit: 2020-11-04 | Discharge: 2020-11-04 | Disposition: A | Discharge status: Home / Self Care | Payer: 59 | Attending: Surgery | Admitting: Surgery

## 2020-11-04 ENCOUNTER — Encounter
Admission: RE | Disposition: A | Discharge status: Home / Self Care | Payer: Self-pay | Source: Home / Self Care | Attending: Surgery

## 2020-11-04 DIAGNOSIS — Z98891 History of uterine scar from previous surgery: Secondary | ICD-10-CM | POA: Diagnosis not present

## 2020-11-04 DIAGNOSIS — Z9079 Acquired absence of other genital organ(s): Secondary | ICD-10-CM | POA: Insufficient documentation

## 2020-11-04 DIAGNOSIS — Z79899 Other long term (current) drug therapy: Secondary | ICD-10-CM | POA: Insufficient documentation

## 2020-11-04 DIAGNOSIS — Z79890 Hormone replacement therapy (postmenopausal): Secondary | ICD-10-CM | POA: Diagnosis not present

## 2020-11-04 DIAGNOSIS — Z8719 Personal history of other diseases of the digestive system: Secondary | ICD-10-CM | POA: Diagnosis not present

## 2020-11-04 DIAGNOSIS — Z90710 Acquired absence of both cervix and uterus: Secondary | ICD-10-CM | POA: Diagnosis not present

## 2020-11-04 DIAGNOSIS — F17210 Nicotine dependence, cigarettes, uncomplicated: Secondary | ICD-10-CM | POA: Insufficient documentation

## 2020-11-04 DIAGNOSIS — K8010 Calculus of gallbladder with chronic cholecystitis without obstruction: Secondary | ICD-10-CM

## 2020-11-04 DIAGNOSIS — K811 Chronic cholecystitis: Secondary | ICD-10-CM

## 2020-11-04 SURGERY — CHOLECYSTECTOMY, ROBOT-ASSISTED, LAPAROSCOPIC
Anesthesia: General

## 2020-11-04 MED ORDER — PROPOFOL 500 MG/50ML IV EMUL
INTRAVENOUS | Status: AC
  Filled 2020-11-04: qty 50

## 2020-11-04 MED ORDER — FENTANYL CITRATE (PF) 100 MCG/2ML IJ SOLN
INTRAMUSCULAR | Status: AC
  Filled 2020-11-04: qty 2

## 2020-11-04 MED ORDER — ONDANSETRON HCL 4 MG/2ML IJ SOLN
INTRAMUSCULAR | Status: DC | PRN
  Administered 2020-11-04: 4 mg via INTRAVENOUS

## 2020-11-04 MED ORDER — CHLORHEXIDINE GLUCONATE CLOTH 2 % EX PADS: 6.0000 | MEDICATED_PAD | Freq: Once | CUTANEOUS | Status: DC

## 2020-11-04 MED ORDER — FAMOTIDINE 20 MG PO TABS
ORAL_TABLET | ORAL | Status: AC
  Administered 2020-11-04: 20 mg via ORAL
  Filled 2020-11-04: qty 1

## 2020-11-04 MED ORDER — INDOCYANINE GREEN 25 MG IV SOLR
1.2500 mg | Freq: Once | INTRAVENOUS | Status: AC
  Administered 2020-11-04: 1.25 mg via INTRAVENOUS
  Filled 2020-11-04: qty 0.5

## 2020-11-04 MED ORDER — LIDOCAINE HCL (CARDIAC) PF 100 MG/5ML IV SOSY
PREFILLED_SYRINGE | INTRAVENOUS | Status: DC | PRN
  Administered 2020-11-04: 80 mg via INTRAVENOUS
  Administered 2020-11-04: 20 mg via INTRAVENOUS

## 2020-11-04 MED ORDER — KETAMINE HCL 10 MG/ML IJ SOLN
INTRAMUSCULAR | Status: DC | PRN
  Administered 2020-11-04: 25 mg via INTRAVENOUS

## 2020-11-04 MED ORDER — FENTANYL CITRATE (PF) 100 MCG/2ML IJ SOLN
INTRAMUSCULAR | Status: AC
  Administered 2020-11-04: 25 ug via INTRAVENOUS
  Filled 2020-11-04: qty 2

## 2020-11-04 MED ORDER — PROPOFOL 10 MG/ML IV BOLUS
INTRAVENOUS | Status: AC
  Filled 2020-11-04: qty 20

## 2020-11-04 MED ORDER — GABAPENTIN 300 MG PO CAPS
ORAL_CAPSULE | ORAL | Status: AC
  Administered 2020-11-04: 300 mg via ORAL
  Filled 2020-11-04: qty 1

## 2020-11-04 MED ORDER — DEXMEDETOMIDINE HCL 200 MCG/2ML IV SOLN
INTRAVENOUS | Status: DC | PRN
  Administered 2020-11-04: 12 ug via INTRAVENOUS

## 2020-11-04 MED ORDER — PROPOFOL 10 MG/ML IV BOLUS
INTRAVENOUS | Status: DC | PRN
  Administered 2020-11-04: 120 mg via INTRAVENOUS
  Administered 2020-11-04: 50 mg via INTRAVENOUS

## 2020-11-04 MED ORDER — ONDANSETRON HCL 4 MG/2ML IJ SOLN
INTRAMUSCULAR | Status: AC
  Filled 2020-11-04: qty 2

## 2020-11-04 MED ORDER — BUPIVACAINE-EPINEPHRINE (PF) 0.25% -1:200000 IJ SOLN
INTRAMUSCULAR | Status: AC
  Filled 2020-11-04: qty 30

## 2020-11-04 MED ORDER — APREPITANT 40 MG PO CAPS
ORAL_CAPSULE | ORAL | Status: AC
  Administered 2020-11-04: 40 mg via ORAL
  Filled 2020-11-04: qty 1

## 2020-11-04 MED ORDER — ORAL CARE MOUTH RINSE: 15.0000 mL | Freq: Once | OROMUCOSAL | Status: AC

## 2020-11-04 MED ORDER — DEXAMETHASONE SODIUM PHOSPHATE 10 MG/ML IJ SOLN
INTRAMUSCULAR | Status: AC
  Filled 2020-11-04: qty 1

## 2020-11-04 MED ORDER — APREPITANT 40 MG PO CAPS: 40.0000 mg | ORAL_CAPSULE | Freq: Once | ORAL | Status: AC

## 2020-11-04 MED ORDER — LACTATED RINGERS IV SOLN: INTRAVENOUS | Status: DC

## 2020-11-04 MED ORDER — DIPHENHYDRAMINE HCL 50 MG/ML IJ SOLN
INTRAMUSCULAR | Status: DC | PRN
  Administered 2020-11-04: 12.5 mg via INTRAVENOUS

## 2020-11-04 MED ORDER — ONDANSETRON HCL 4 MG/2ML IJ SOLN: 4.0000 mg | Freq: Once | INTRAMUSCULAR | Status: DC | PRN

## 2020-11-04 MED ORDER — EPHEDRINE SULFATE 50 MG/ML IJ SOLN
INTRAMUSCULAR | Status: DC | PRN
  Administered 2020-11-04: 5 mg via INTRAVENOUS
  Administered 2020-11-04: 10 mg via INTRAVENOUS

## 2020-11-04 MED ORDER — DEXAMETHASONE SODIUM PHOSPHATE 10 MG/ML IJ SOLN
INTRAMUSCULAR | Status: DC | PRN
  Administered 2020-11-04: 5 mg via INTRAVENOUS

## 2020-11-04 MED ORDER — CEFAZOLIN SODIUM-DEXTROSE 2-4 GM/100ML-% IV SOLN
2.0000 g | INTRAVENOUS | Status: AC
  Administered 2020-11-04: 2 g via INTRAVENOUS

## 2020-11-04 MED ORDER — CHLORHEXIDINE GLUCONATE 0.12 % MT SOLN
OROMUCOSAL | Status: AC
  Administered 2020-11-04: 15 mL via OROMUCOSAL
  Filled 2020-11-04: qty 15

## 2020-11-04 MED ORDER — CHLORHEXIDINE GLUCONATE 0.12 % MT SOLN: 15.0000 mL | Freq: Once | OROMUCOSAL | Status: AC

## 2020-11-04 MED ORDER — CELECOXIB 200 MG PO CAPS: 200.0000 mg | ORAL_CAPSULE | ORAL | Status: AC

## 2020-11-04 MED ORDER — CEFAZOLIN SODIUM-DEXTROSE 2-4 GM/100ML-% IV SOLN
INTRAVENOUS | Status: AC
  Filled 2020-11-04: qty 100

## 2020-11-04 MED ORDER — GABAPENTIN 300 MG PO CAPS: 300.0000 mg | ORAL_CAPSULE | ORAL | Status: AC

## 2020-11-04 MED ORDER — HYDROCODONE-ACETAMINOPHEN 5-325 MG PO TABS: 1.0000 | ORAL_TABLET | Freq: Four times a day (QID) | ORAL | 0 refills | Status: DC | PRN

## 2020-11-04 MED ORDER — BUPIVACAINE-EPINEPHRINE (PF) 0.25% -1:200000 IJ SOLN
INTRAMUSCULAR | Status: DC | PRN
  Administered 2020-11-04: 30 mL via PERINEURAL

## 2020-11-04 MED ORDER — ACETAMINOPHEN 500 MG PO TABS
1000.0000 mg | ORAL_TABLET | ORAL | Status: AC
Start: 2020-11-04 — End: 2020-11-04

## 2020-11-04 MED ORDER — SUGAMMADEX SODIUM 200 MG/2ML IV SOLN
INTRAVENOUS | Status: DC | PRN
  Administered 2020-11-04: 200 mg via INTRAVENOUS

## 2020-11-04 MED ORDER — IBUPROFEN 800 MG PO TABS: 800.0000 mg | ORAL_TABLET | Freq: Three times a day (TID) | ORAL | 0 refills | Status: DC | PRN

## 2020-11-04 MED ORDER — LIDOCAINE HCL (PF) 2 % IJ SOLN
INTRAMUSCULAR | Status: AC
  Filled 2020-11-04: qty 5

## 2020-11-04 MED ORDER — LABETALOL HCL 5 MG/ML IV SOLN
INTRAVENOUS | Status: AC
  Filled 2020-11-04: qty 4

## 2020-11-04 MED ORDER — ACETAMINOPHEN 500 MG PO TABS
ORAL_TABLET | ORAL | Status: AC
  Administered 2020-11-04: 1000 mg via ORAL
  Filled 2020-11-04: qty 2

## 2020-11-04 MED ORDER — HYDROCODONE-ACETAMINOPHEN 5-325 MG PO TABS: 1.0000 | ORAL_TABLET | Freq: Once | ORAL | Status: AC

## 2020-11-04 MED ORDER — BUPIVACAINE LIPOSOME 1.3 % IJ SUSP: 20.0000 mL | Freq: Once | INTRAMUSCULAR | Status: DC

## 2020-11-04 MED ORDER — ROCURONIUM BROMIDE 10 MG/ML (PF) SYRINGE
PREFILLED_SYRINGE | INTRAVENOUS | Status: AC
  Filled 2020-11-04: qty 10

## 2020-11-04 MED ORDER — FENTANYL CITRATE (PF) 100 MCG/2ML IJ SOLN
25.0000 ug | INTRAMUSCULAR | Status: DC | PRN
  Administered 2020-11-04 (×3): 25 ug via INTRAVENOUS

## 2020-11-04 MED ORDER — ROCURONIUM BROMIDE 100 MG/10ML IV SOLN
INTRAVENOUS | Status: DC | PRN
  Administered 2020-11-04: 50 mg via INTRAVENOUS

## 2020-11-04 MED ORDER — FENTANYL CITRATE (PF) 100 MCG/2ML IJ SOLN
INTRAMUSCULAR | Status: DC | PRN
  Administered 2020-11-04: 50 ug via INTRAVENOUS
  Administered 2020-11-04: 25 ug via INTRAVENOUS

## 2020-11-04 MED ORDER — MIDAZOLAM HCL 2 MG/2ML IJ SOLN
INTRAMUSCULAR | Status: DC | PRN
  Administered 2020-11-04: 2 mg via INTRAVENOUS

## 2020-11-04 MED ORDER — FAMOTIDINE 20 MG PO TABS: 20.0000 mg | ORAL_TABLET | Freq: Once | ORAL | Status: AC

## 2020-11-04 MED ORDER — MIDAZOLAM HCL 2 MG/2ML IJ SOLN
INTRAMUSCULAR | Status: AC
  Filled 2020-11-04: qty 2

## 2020-11-04 MED ORDER — CELECOXIB 200 MG PO CAPS
ORAL_CAPSULE | ORAL | Status: AC
  Administered 2020-11-04: 200 mg via ORAL
  Filled 2020-11-04: qty 1

## 2020-11-04 MED ORDER — PROPOFOL 500 MG/50ML IV EMUL
INTRAVENOUS | Status: DC | PRN
  Administered 2020-11-04: 175 ug/kg/min via INTRAVENOUS

## 2020-11-04 MED ORDER — DEXMEDETOMIDINE (PRECEDEX) IN NS 20 MCG/5ML (4 MCG/ML) IV SYRINGE
PREFILLED_SYRINGE | INTRAVENOUS | Status: AC
  Filled 2020-11-04: qty 5

## 2020-11-04 MED ORDER — HYDROCODONE-ACETAMINOPHEN 5-325 MG PO TABS
ORAL_TABLET | ORAL | Status: AC
  Administered 2020-11-04: 1 via ORAL
  Filled 2020-11-04: qty 1

## 2020-11-04 MED ORDER — DIPHENHYDRAMINE HCL 50 MG/ML IJ SOLN
INTRAMUSCULAR | Status: AC
  Filled 2020-11-04: qty 1

## 2020-11-04 SURGICAL SUPPLY — 51 items
"PENCIL ELECTRO HAND CTR " (MISCELLANEOUS) ×1 IMPLANT
ADH SKN CLS APL DERMABOND .7 (GAUZE/BANDAGES/DRESSINGS) ×1
APL PRP STRL LF DISP 70% ISPRP (MISCELLANEOUS) ×1
BAG SPEC RTRVL LRG 6X4 10 (ENDOMECHANICALS) ×1
CANISTER SUCT 1200ML W/VALVE (MISCELLANEOUS) ×1 IMPLANT
CANNULA CAP OBTURATR AIRSEAL 8 (CAP) ×2 IMPLANT
CHLORAPREP W/TINT 26 (MISCELLANEOUS) ×2 IMPLANT
CLIP VESOLOCK LG 6/CT PURPLE (CLIP) ×2 IMPLANT
COVER TIP SHEARS 8 DVNC (MISCELLANEOUS) ×1 IMPLANT
COVER TIP SHEARS 8MM DA VINCI (MISCELLANEOUS) ×1
COVER WAND RF STERILE (DRAPES) ×2 IMPLANT
DECANTER SPIKE VIAL GLASS SM (MISCELLANEOUS) ×2 IMPLANT
DEFOGGER SCOPE WARMER CLEARIFY (MISCELLANEOUS) ×2 IMPLANT
DERMABOND ADVANCED (GAUZE/BANDAGES/DRESSINGS) ×1
DERMABOND ADVANCED .7 DNX12 (GAUZE/BANDAGES/DRESSINGS) ×1 IMPLANT
DRAPE ARM DVNC X/XI (DISPOSABLE) ×4 IMPLANT
DRAPE COLUMN DVNC XI (DISPOSABLE) ×1 IMPLANT
DRAPE DA VINCI XI ARM (DISPOSABLE) ×4
DRAPE DA VINCI XI COLUMN (DISPOSABLE) ×1
ELECT CAUTERY BLADE 6.4 (BLADE) ×2 IMPLANT
GLOVE SURG ORTHO LTX SZ7.5 (GLOVE) ×6 IMPLANT
GOWN STRL REUS W/ TWL LRG LVL3 (GOWN DISPOSABLE) ×4 IMPLANT
GOWN STRL REUS W/TWL LRG LVL3 (GOWN DISPOSABLE) ×8
GRASPER SUT TROCAR 14GX15 (MISCELLANEOUS) IMPLANT
INFUSOR MANOMETER BAG 3000ML (MISCELLANEOUS) IMPLANT
IRRIGATION STRYKERFLOW (MISCELLANEOUS) IMPLANT
IRRIGATOR STRYKERFLOW (MISCELLANEOUS)
IRRIGATOR SUCT 8 DISP DVNC XI (IRRIGATION / IRRIGATOR) IMPLANT
IRRIGATOR SUCTION 8MM XI DISP (IRRIGATION / IRRIGATOR)
IV NS IRRIG 3000ML ARTHROMATIC (IV SOLUTION) IMPLANT
KIT PINK PAD W/HEAD ARE REST (MISCELLANEOUS) ×2
KIT PINK PAD W/HEAD ARM REST (MISCELLANEOUS) ×1 IMPLANT
KIT TURNOVER KIT A (KITS) ×2 IMPLANT
LABEL OR SOLS (LABEL) ×2 IMPLANT
MANIFOLD NEPTUNE II (INSTRUMENTS) ×2 IMPLANT
NDL INSUFFLATION 14GA 120MM (NEEDLE) IMPLANT
NEEDLE HYPO 22GX1.5 SAFETY (NEEDLE) ×2 IMPLANT
NEEDLE INSUFFLATION 14GA 120MM (NEEDLE) IMPLANT
NS IRRIG 500ML POUR BTL (IV SOLUTION) ×2 IMPLANT
PACK LAP CHOLECYSTECTOMY (MISCELLANEOUS) ×2 IMPLANT
PENCIL ELECTRO HAND CTR (MISCELLANEOUS) ×2 IMPLANT
POUCH SPECIMEN RETRIEVAL 10MM (ENDOMECHANICALS) ×2 IMPLANT
SEAL CANN UNIV 5-8 DVNC XI (MISCELLANEOUS) ×3 IMPLANT
SEAL XI 5MM-8MM UNIVERSAL (MISCELLANEOUS) ×3
SET TUBE FILTERED XL AIRSEAL (SET/KITS/TRAYS/PACK) ×2 IMPLANT
SOLUTION ELECTROLUBE (MISCELLANEOUS) ×2 IMPLANT
SUT MNCRL 4-0 (SUTURE) ×2
SUT MNCRL 4-0 27XMFL (SUTURE) ×1
SUT VICRYL 0 AB UR-6 (SUTURE) ×2 IMPLANT
SUTURE MNCRL 4-0 27XMF (SUTURE) ×1 IMPLANT
TROCAR Z-THREAD FIOS 11X100 BL (TROCAR) ×2 IMPLANT

## 2020-11-04 NOTE — Op Note (Signed)
Robotic cholecystectomy  Pre-operative Diagnosis: Chronic symptomatic cholecystitis, gallbladder polyps.  Post-operative Diagnosis:  Same.  Procedure: Robotic assisted laparoscopic cholecystectomy.  Surgeon: Ronny Bacon, M.D., FACS  Anesthesia: General. with endotracheal tube  Findings: Mild adhesions.  Estimated Blood Loss: 5 mL         Drains: None         Specimens: Gallbladder           Complications: none  Procedure Details  The patient was seen again in the Holding Room.  2.5 mg dose of ICG was administered intravenously.   The benefits, complications, treatment options, risks and expected outcomes were again reviewed with the patient. The likelihood of improving the patient's symptoms with return to their baseline status is good.  The patient and/or family concurred with the proposed plan, giving informed consent, again alternatives reviewed.  The patient was taken to Operating Room, identified, and the procedure verified as robotic assisted laparoscopic cholecystectomy.  Prior to the induction of general anesthesia, antibiotic prophylaxis was administered. VTE prophylaxis was in place. General endotracheal anesthesia was then administered and tolerated well. The patient was positioned in the supine position.  After the induction, the abdomen was prepped with Chloraprep and draped in the sterile fashion.  A Time Out was held and the above information confirmed.  After local infiltration of quarter percent Marcaine with epinephrine, stab incision was made left upper quadrant.  Just below the costal margin at Palmer's point, approximately midclavicular line the Veres needle is passed with sensation of the layers to penetrate the abdominal wall and into the peritoneum.  Saline drop test is confirmed peritoneal placement.  Insufflation is initiated with carbon dioxide to pressures of 15 mmHg.  Right infra-umbilical local infiltration with quarter percent Marcaine with  epinephrine is utilized.  Made a 12 mm incision on the right periumbilical site, I advanced an optical 5mm port under direct visualization into the peritoneal cavity.  Once the peritoneum was penetrated, insufflation was initiated.  The trocar was then advanced into the abdominal cavity under direct visualization. Pneumoperitoneum was then continued with Air seal utilizing CO2 at 15 mmHg or less and tolerated well without any adverse changes in the patient's vital signs.  Two 8.5-mm ports were placed in the left lower quadrant and laterally, and one to the right lower quadrant, all under direct vision. All skin incisions  were infiltrated with a local anesthetic agent before making the incision and placing the trocars.  The patient was positioned  in reverse Trendelenburg, tilted the patient's left side down.  Da Vinci XI robot was then positioned on to the patient's left side, and docked.  The gallbladder was identified, the fundus grasped via the arm 4 Prograsp and retracted cephalad. Adhesions were lysed with scissors and cautery.  The infundibulum was identified grasped and retracted laterally, exposing the peritoneum overlying the triangle of Calot. This was then opened and dissected using cautery & scissors. An extended critical view of the cystic duct and cystic artery was obtained, aided by the ICG via FireFly which enabled ready visualization of the ductal anatomy.    The cystic duct was clearly identified and dissected to isolation.   Artery well isolated and clipped, and the cystic duct was triple clipped and divided with scissors, as close to the gallbladder neck as feasible, thus leaving two on the remaining stump.  The specimen side of the artery is sealed with bipolar and divided with monopolar scissors.   The gallbladder was taken from the gallbladder  fossa in a retrograde fashion with the electrocautery. The gallbladder was removed and placed in an Endocatch bag.  The liver bed is  inspected. Hemostasis was confirmed.  The robot was undocked and moved away from the operative field. No irrigation was utilized.  The gallbladder and Endocatch sac were then removed through the infraumbilical port site without any additional dilation.   Inspection of the right upper quadrant was performed. No bleeding, bile duct injury or leak, or bowel injury was noted. The infra-umbilical port site fascia was closed with interrumpted 0 Vicryl sutures using PMI/cone under direct visualization. Pneumoperitoneum was released and ports removed.  4-0 subcuticular Monocryl was used to close the skin. Dermabond was  applied.  The patient was then extubated and brought to the recovery room in stable condition. Sponge, lap, and needle counts were correct at closure and at the conclusion of the case.               Ronny Bacon, M.D., [facility] 11/04/2020 3:48 PM

## 2020-11-04 NOTE — Anesthesia Preprocedure Evaluation (Addendum)
Anesthesia Evaluation  Patient identified by MRN, date of birth, ID band Patient awake    Reviewed: Allergy & Precautions, H&P , NPO status , Patient's Chart, lab work & pertinent test results, reviewed documented beta blocker date and time   History of Anesthesia Complications (+) PONV, Family history of anesthesia reaction and history of anesthetic complications  Airway Mallampati: III  TM Distance: >3 FB Neck ROM: full    Dental no notable dental hx.    Pulmonary Current Smoker and Patient abstained from smoking.,    Pulmonary exam normal breath sounds clear to auscultation       Cardiovascular Exercise Tolerance: Good negative cardio ROS Normal cardiovascular exam Rhythm:regular Rate:Normal     Neuro/Psych  Headaches, PSYCHIATRIC DISORDERS Anxiety Depression Depression   GI/Hepatic Neg liver ROS, GERD  ,  Endo/Other  Hypothyroidism   Renal/GU negative Renal ROS  negative genitourinary   Musculoskeletal  (+) Arthritis ,   Abdominal   Peds  Hematology negative hematology ROS (+)   Anesthesia Other Findings Past Medical History: No date: Arthritis     Comment:  hands No date: Family history of adverse reaction to anesthesia     Comment:  mother has N/V No date: Headache     Comment:  2 or 3/week No date: Hypothyroidism No date: PONV (postoperative nausea and vomiting) No date: Vertigo     Comment:  dizzyness/ comes and goes  Reproductive/Obstetrics negative OB ROS                             Anesthesia Physical  Anesthesia Plan  ASA: 2  Anesthesia Plan: General   Post-op Pain Management:    Induction: Intravenous  PONV Risk Score and Plan:   Airway Management Planned: Oral ETT  Additional Equipment:   Intra-op Plan:   Post-operative Plan: Extubation in OR  Informed Consent: I have reviewed the patients History and Physical, chart, labs and discussed the procedure  including the risks, benefits and alternatives for the proposed anesthesia with the patient or authorized representative who has indicated his/her understanding and acceptance.     Dental Advisory Given and Dental advisory given  Plan Discussed with: CRNA  Anesthesia Plan Comments:         Anesthesia Quick Evaluation

## 2020-11-04 NOTE — Anesthesia Postprocedure Evaluation (Signed)
Anesthesia Post Note  Patient: Mary Miller  Procedure(s) Performed: XI ROBOTIC ASSISTED LAPAROSCOPIC CHOLECYSTECTOMY  Patient location during evaluation: PACU Anesthesia Type: General Level of consciousness: awake and alert Pain management: pain level controlled Vital Signs Assessment: post-procedure vital signs reviewed and stable Respiratory status: spontaneous breathing, nonlabored ventilation, respiratory function stable and patient connected to nasal cannula oxygen Cardiovascular status: blood pressure returned to baseline and stable Postop Assessment: no apparent nausea or vomiting Anesthetic complications: no   No notable events documented.   Last Vitals:  Vitals:   11/04/20 1615 11/04/20 1646  BP: 112/77 122/79  Pulse: 70 78  Resp: 14 18  Temp: (!) 36.4 C 36.5 C  SpO2: 95% 93%    Last Pain:  Vitals:   11/04/20 1705  TempSrc:   PainSc: 7                  Arita Miss

## 2020-11-04 NOTE — Anesthesia Procedure Notes (Signed)
Procedure Name: Intubation Date/Time: 11/04/2020 2:41 PM Performed by: Lia Foyer, CRNA Pre-anesthesia Checklist: Patient identified, Emergency Drugs available, Suction available and Patient being monitored Patient Re-evaluated:Patient Re-evaluated prior to induction Oxygen Delivery Method: Circle system utilized Preoxygenation: Pre-oxygenation with 100% oxygen Induction Type: IV induction Ventilation: Mask ventilation without difficulty Laryngoscope Size: McGraph and 4 Grade View: Grade I Tube type: Oral Tube size: 7.0 mm Number of attempts: 1 Airway Equipment and Method: Stylet, Video-laryngoscopy and Oral airway Placement Confirmation: ETT inserted through vocal cords under direct vision, positive ETCO2 and breath sounds checked- equal and bilateral Secured at: 20 cm Tube secured with: Tape Dental Injury: Teeth and Oropharynx as per pre-operative assessment

## 2020-11-04 NOTE — Discharge Instructions (Addendum)
AMBULATORY SURGERY  DISCHARGE INSTRUCTIONS   The drugs that you were given will stay in your system until tomorrow so for the next 24 hours you should not:  Drive an automobile Make any legal decisions Drink any alcoholic beverage   You may resume regular meals tomorrow.  Today it is better to start with liquids and gradually work up to solid foods.  You may eat anything you prefer, but it is better to start with liquids, then soup and crackers, and gradually work up to solid foods.   Please notify your doctor immediately if you have any unusual bleeding, trouble breathing, redness and pain at the surgery site, drainage, fever, or pain not relieved by medication.       Please contact your physician with any problems or Same Day Surgery at [phone], Monday through Friday 6 am to 4 pm, or [place] at  Main number at [phone].  

## 2020-11-04 NOTE — Transfer of Care (Signed)
Immediate Anesthesia Transfer of Care Note  Patient: Mary Miller  Procedure(s) Performed: XI ROBOTIC ASSISTED LAPAROSCOPIC CHOLECYSTECTOMY  Patient Location: PACU  Anesthesia Type:General  Level of Consciousness: drowsy  Airway & Oxygen Therapy: Patient Spontanous Breathing and Patient connected to face mask oxygen  Post-op Assessment: Report given to RN and Post -op Vital signs reviewed and stable  Post vital signs: Reviewed and stable  Last Vitals:  Vitals Value Taken Time  BP 113/69 11/04/20 1553  Temp    Pulse 68 11/04/20 1559  Resp 20 11/04/20 1559  SpO2 100 % 11/04/20 1559  Vitals shown include unvalidated device data.  Last Pain:  Vitals:   11/04/20 1252  TempSrc: Oral  PainSc: 4          Complications: No notable events documented.

## 2020-11-04 NOTE — Interval H&P Note (Signed)
History and Physical Interval Note:  11/04/2020 2:04 PM  Mary Miller  has presented today for surgery, with the diagnosis of chronic cholecystitis.  The various methods of treatment have been discussed with the patient and family. After consideration of risks, benefits and other options for treatment, the patient has consented to  Procedure(s): XI ROBOTIC [place] (N/A) as a surgical intervention.  The patient's history has been reviewed, patient examined, no change in status, stable for surgery.  I have reviewed the patient's chart and labs.  Questions were answered to the patient's satisfaction.     Ronny Bacon

## 2020-11-06 LAB — SURGICAL PATHOLOGY

## 2020-11-12 ENCOUNTER — Other Ambulatory Visit: Payer: Self-pay

## 2020-11-12 ENCOUNTER — Ambulatory Visit (INDEPENDENT_AMBULATORY_CARE_PROVIDER_SITE_OTHER): Payer: 59 | Admitting: Surgery

## 2020-11-12 ENCOUNTER — Encounter: Payer: Self-pay | Admitting: Surgery

## 2020-11-12 VITALS — BP 160/91 | HR 66 | Temp 98.3°F | Ht 62.0 in | Wt 150.0 lb

## 2020-11-12 DIAGNOSIS — Z9049 Acquired absence of other specified parts of digestive tract: Secondary | ICD-10-CM | POA: Insufficient documentation

## 2020-11-12 NOTE — Progress Notes (Signed)
[facility] POST-OP OFFICE VISIT  11/12/2020  HPI: Mary Miller is a 55 y.o. female 8 days s/p robotic cholecystectomy, reports complete resumption of diet, with some loose stools.  Mild cramping but otherwise well-tolerated.  Denies fevers or chills.  Denies nausea and vomiting.  Vital signs: BP (!) 160/91   Pulse 66   Temp 98.3 F (36.8 C)   Ht 5\' 2"  (1.575 m)   Wt 150 lb (68 kg)   SpO2 97%   BMI 27.44 kg/m    Physical Exam: Constitutional: She appears quite well. Abdomen: Nondistended, soft and nontender. Skin: Incisions are clean, dry and intact.  Assessment/Plan: This is a 55 y.o. female 8 days s/p robotic cholecystectomy.  Patient Active Problem List   Diagnosis Date Noted   Chronic cholecystitis 10/29/2020   Special screening for malignant neoplasms, colon    Benign neoplasm of transverse colon    Polyp of sigmoid colon    Carpal tunnel syndrome 04/20/2016   Clinical depression 04/20/2016   Borderline diabetes 04/20/2016   Insomnia 11/17/2014   Allergic rhinitis 09/02/2009   Anxiety 10/09/2008   Adult hypothyroidism 08/07/2008   Combined fat and carbohydrate induced hyperlipemia 08/07/2008   Current tobacco use 07/01/2008   Acid reflux 07/01/2008    -May follow-up as needed.     Ronny Bacon M.D., FACS 11/12/2020, 1:18 PM

## 2020-11-12 NOTE — Patient Instructions (Addendum)
The diarrhea should get better. Continue to slowly advance your diet. Follow-up with our office as needed.  Please call and ask to speak with a nurse if you develop questions or concerns.  You should be able to swim in about a week.    GENERAL POST-OPERATIVE PATIENT INSTRUCTIONS   WOUND CARE INSTRUCTIONS: Try to keep the wound dry and avoid ointments on the wound unless directed to do so.  If the wound becomes bright red and painful or starts to drain infected material that is not clear, please contact your physician immediately.  If the wound is mildly pink and has a thick firm ridge underneath it, this is normal, and is referred to as a healing ridge.  This will resolve over the next 4-6 weeks.  BATHING: You may shower if you have been informed of this by your surgeon. However, Please do not submerge in a tub, hot tub, or pool until incisions are completely sealed or have been told by your surgeon that you may do so.  DIET:  You may eat any foods that you can tolerate.  It is a good idea to eat a high fiber diet and take in plenty of fluids to prevent constipation.  If you do become constipated you may want to take a mild laxative or take ducolax tablets on a daily basis until your bowel habits are regular.  Constipation can be very uncomfortable, along with straining, after recent surgery.  ACTIVITY: You are encouraged to walk and engage in light activity for the next two weeks.  You should not lift more than 20 pounds for 6 weeks after surgery as it could put you at increased risk for complications.  Twenty pounds is roughly equivalent to a plastic bag of groceries. At that time- Listen to your body when lifting, if you have pain when lifting, stop and then try again in a few days. Soreness after doing exercises or activities of daily living is normal as you get back in to your normal routine.  MEDICATIONS:  Try to take narcotic medications and anti-inflammatory medications, such as tylenol,  ibuprofen, naprosyn, etc., with food.  This will minimize stomach upset from the medication.  Should you develop nausea and vomiting from the pain medication, or develop a rash, please discontinue the medication and contact your physician.  You should not drive, make important decisions, or operate machinery when taking narcotic pain medication.  SUNBLOCK Use sun block to incision area over the next year if this area will be exposed to sun. This helps decrease scarring and will allow you avoid a permanent darkened area over your incision.  QUESTIONS:  Please feel free to call our office if you have any questions, and we will be glad to assist you.

## 2021-01-19 ENCOUNTER — Telehealth: Payer: Self-pay | Admitting: Family Medicine

## 2021-01-19 DIAGNOSIS — E782 Mixed hyperlipidemia: Secondary | ICD-10-CM

## 2021-01-19 MED ORDER — ATORVASTATIN CALCIUM 20 MG PO TABS: 20.0000 mg | ORAL_TABLET | Freq: Every day | ORAL | 1 refills | Status: DC

## 2021-01-19 NOTE — Telephone Encounter (Signed)
[facility] faxed refill request for the following medications:  atorvastatin (LIPITOR) 20 MG tablet   Please advise.  

## 2021-01-21 ENCOUNTER — Other Ambulatory Visit: Payer: Self-pay | Admitting: Family Medicine

## 2021-01-21 DIAGNOSIS — E039 Hypothyroidism, unspecified: Secondary | ICD-10-CM

## 2021-01-21 NOTE — Telephone Encounter (Signed)
LOV 11/02/20 No future visit scheduled at this time.  Last TSH 03/30/20 WNL. Medication last ordered 10/15/20 #90 tabs with one refill. Approved per protocol for the patient's future refill of 90 day supply. Requested Prescriptions  Pending Prescriptions Disp Refills  . levothyroxine (SYNTHROID) 88 MCG tablet [Pharmacy Med Name: LEVOTHYROXINE 0.'088MG'$  (88MCG) TAB] 90 tablet 0    Sig: TAKE 1 TABLET(88 MCG) BY MOUTH DAILY     Endocrinology:  Hypothyroid Agents Failed - 01/21/2021  8:00 AM      Failed - TSH needs to be rechecked within 3 months after an abnormal result. Refill until TSH is due.      Passed - TSH in normal range and within 360 days    TSH  Date Value Ref Range Status  03/30/2020 1.240 0.450 - 4.500 uIU/mL Final         Passed - Valid encounter within last 12 months    Recent Outpatient Visits          2 months ago Annual physical exam   [place], PA-C   3 months ago RUQ abdominal pain   [place], PA-C   7 months ago Close exposure to COVID-19 virus   [employer], [place], PA-C   9 months ago [place], [place], [place]   1 year ago Suspected COVID-19 virus infection   [place], [place], [place]

## 2021-05-04 ENCOUNTER — Encounter: Payer: Self-pay | Admitting: Family Medicine

## 2021-05-04 ENCOUNTER — Other Ambulatory Visit: Payer: Self-pay

## 2021-05-04 ENCOUNTER — Ambulatory Visit: Payer: 59 | Admitting: Family Medicine

## 2021-05-04 VITALS — BP 121/82 | HR 74 | Resp 15 | Wt 147.4 lb

## 2021-05-04 DIAGNOSIS — F4322 Adjustment disorder with anxiety: Secondary | ICD-10-CM

## 2021-05-04 DIAGNOSIS — E049 Nontoxic goiter, unspecified: Secondary | ICD-10-CM | POA: Insufficient documentation

## 2021-05-04 DIAGNOSIS — N951 Menopausal and female climacteric states: Secondary | ICD-10-CM | POA: Diagnosis not present

## 2021-05-04 DIAGNOSIS — L659 Nonscarring hair loss, unspecified: Secondary | ICD-10-CM

## 2021-05-04 DIAGNOSIS — Z1231 Encounter for screening mammogram for malignant neoplasm of breast: Secondary | ICD-10-CM

## 2021-05-04 DIAGNOSIS — R739 Hyperglycemia, unspecified: Secondary | ICD-10-CM | POA: Insufficient documentation

## 2021-05-04 DIAGNOSIS — R7303 Prediabetes: Secondary | ICD-10-CM

## 2021-05-04 DIAGNOSIS — Z1159 Encounter for screening for other viral diseases: Secondary | ICD-10-CM

## 2021-05-04 DIAGNOSIS — E039 Hypothyroidism, unspecified: Secondary | ICD-10-CM

## 2021-05-04 DIAGNOSIS — E7800 Pure hypercholesterolemia, unspecified: Secondary | ICD-10-CM

## 2021-05-04 DIAGNOSIS — R5382 Chronic fatigue, unspecified: Secondary | ICD-10-CM | POA: Diagnosis not present

## 2021-05-04 MED ORDER — PAROXETINE HCL 20 MG PO TABS: 20.0000 mg | ORAL_TABLET | Freq: Every day | ORAL | 1 refills | Status: DC

## 2021-05-04 NOTE — Assessment & Plan Note (Signed)
Thinning on head, hair loss in eye brows

## 2021-05-04 NOTE — Assessment & Plan Note (Signed)
Low risk screen °

## 2021-05-04 NOTE — Assessment & Plan Note (Signed)
Labs pending prior to Rx refill

## 2021-05-04 NOTE — Assessment & Plan Note (Signed)
Repeat a1c °Continue to recommend balanced, lower carb meals. Smaller meal size, adding snacks. Choosing water as drink of choice and increasing purposeful exercise. ° °

## 2021-05-04 NOTE — Assessment & Plan Note (Signed)
US ordered

## 2021-05-04 NOTE — Assessment & Plan Note (Signed)
recommend diet low in saturated fat and regular exercise - 30 min at least 5 times per week

## 2021-05-04 NOTE — Progress Notes (Signed)
Established patient visit   Patient: Mary Miller   DOB: 02-Dec-1965   55 y.o. Female  MRN: [id number] Visit Date: 05/04/2021  Today's healthcare provider: Gwyneth Sprout, FNP   Chief Complaint  Patient presents with   Hypothyroidism   Hyperlipidemia   Subjective    HPI  Hypothyroid, follow-up  Lab Results  Component Value Date   TSH 1.240 03/30/2020   TSH 2.370 01/30/2019   TSH 1.800 09/25/2017    Wt Readings from Last 3 Encounters:  05/04/21 147 lb 6.4 oz (66.9 kg)  11/12/20 150 lb (68 kg)  11/04/20 150 lb (68 kg)    She was last seen for hypothyroid 13 months ago.  Management since that visit includes none. She reports excellent compliance with treatment. She is not having side effects.   Symptoms: Yes change in energy level No constipation  No diarrhea Yes heat / cold intolerance  Yes nervousness No palpitations  No weight changes    -----------------------------------------------------------------------------------------  Lipid/Cholesterol, Follow-up  Last lipid panel Other pertinent labs  Lab Results  Component Value Date   CHOL 256 (H) 03/30/2020   HDL 39 (L) 03/30/2020   LDLCALC 111 (H) 03/30/2020   LDLDIRECT 176 (H) 01/30/2019   TRIG 608 (HH) 03/30/2020   CHOLHDL 6.6 (H) 03/30/2020   Lab Results  Component Value Date   ALT 23 10/13/2020   AST 24 10/13/2020   PLT 314 10/13/2020   TSH 1.240 03/30/2020     She was last seen for this 13 months ago.  Management since that visit includes increase Lipitor to 20mg  qhs.  She reports excellent compliance with treatment. She is not having side effects.   Symptoms: No chest pain No chest pressure/discomfort  No dyspnea No lower extremity edema  No numbness or tingling of extremity No orthopnea  No palpitations No paroxysmal nocturnal dyspnea  No speech difficulty No syncope   Current diet: well balanced Current exercise: walking  The 10-year ASCVD risk score (Arnett DK, et al., 2019)  is: 8%  ---------------------------------------------------------------------------------------------------   Medications: Outpatient Medications Prior to Visit  Medication Sig   atorvastatin (LIPITOR) 20 MG tablet Take 1 tablet (20 mg total) by mouth daily.   ibuprofen (ADVIL) 800 MG tablet Take 1 tablet (800 mg total) by mouth every 8 (eight) hours as needed.   levothyroxine (SYNTHROID) 88 MCG tablet TAKE 1 TABLET(88 MCG) BY MOUTH DAILY   traZODone (DESYREL) 100 MG tablet TAKE 1 TABLET BY MOUTH AT BEDTIME AS NEEDED FOR SLEEP   No facility-administered medications prior to visit.    Review of Systems     Objective    BP 121/82    Pulse 74    Resp 15    Wt 147 lb 6.4 oz (66.9 kg)    BMI 26.96 kg/m    Physical Exam Vitals and nursing note reviewed.  Constitutional:      General: She is not in acute distress.    Appearance: Normal appearance. She is overweight. She is not ill-appearing, toxic-appearing or diaphoretic.  HENT:     Head: Normocephalic and atraumatic.  Neck:     Thyroid: Thyromegaly present. No thyroid mass or thyroid tenderness.  Cardiovascular:     Rate and Rhythm: Normal rate and regular rhythm.     Pulses: Normal pulses.     Heart sounds: Normal heart sounds. No murmur heard.   No friction rub. No gallop.  Pulmonary:     Effort: Pulmonary effort is normal.  No respiratory distress.     Breath sounds: Normal breath sounds. No stridor. No wheezing, rhonchi or rales.  Chest:     Chest wall: No tenderness.  Abdominal:     General: Bowel sounds are normal.     Palpations: Abdomen is soft.  Musculoskeletal:        General: No swelling, tenderness, deformity or signs of injury. Normal range of motion.     Cervical back: Neck supple.     Right lower leg: No edema.     Left lower leg: No edema.  Skin:    General: Skin is warm and dry.     Capillary Refill: Capillary refill takes less than 2 seconds.     Coloration: Skin is not jaundiced or pale.      Findings: No bruising, erythema, lesion or rash.  Neurological:     General: No focal deficit present.     Mental Status: She is alert and oriented to person, place, and time. Mental status is at baseline.     Cranial Nerves: No cranial nerve deficit.     Sensory: No sensory deficit.     Motor: No weakness.     Coordination: Coordination normal.  Psychiatric:        Mood and Affect: Mood normal.        Behavior: Behavior normal.        Thought Content: Thought content normal.        Judgment: Judgment normal.     No results found for any visits on 05/04/21.  Assessment & Plan     Problem List Items Addressed This Visit       Cardiovascular and Mediastinum   Hot flashes due to menopause    Has interfered with sleep Hx of hysterectomy in past- with hot flashes at that time      Relevant Medications   PARoxetine (PAXIL) 20 MG tablet     Endocrine   Adult hypothyroidism - Primary    Labs pending prior to Rx refill      Relevant Orders   TSH + free T4   US THYROID   Enlarged thyroid    [place] ordered      Relevant Orders   US THYROID     Other   Adjustment disorder with anxiety    Potential for multiple sources Recommend journal to trend/note stress sources Plan for CPE Start SSRI for hot flashes- may assist with anxiety Notes anxiety in her son as well      Relevant Orders   TSH + free T4   US THYROID   Hair loss    Thinning on head, hair loss in eye brows      Relevant Orders   TSH + free T4   US THYROID   Chronic fatigue    Start with general lab work and thyroid checks      Relevant Orders   TSH + free T4   US THYROID   Prediabetes    Repeat a1c Continue to recommend balanced, lower carb meals. Smaller meal size, adding snacks. Choosing water as drink of choice and increasing purposeful exercise.       Relevant Orders   Hemoglobin A1c   Elevated serum glucose    A1c pending      Relevant Orders   Comprehensive metabolic panel   Elevated  LDL cholesterol level    recommend diet low in saturated fat and regular exercise - 30 min at least 5 times per week  Relevant Orders   Lipid panel   Encounter for hepatitis C screening test for low risk patient    Low risk screen      Relevant Orders   Hepatitis C Antibody   Encounter for screening mammogram for malignant neoplasm of breast    Denies concerns; due for mammogram      Relevant Orders   MM 3D [place]     Return for annual examination.      Vonna Kotyk, FNP, have reviewed all documentation for this visit. The documentation on 05/04/21 for the exam, diagnosis, procedures, and orders are all accurate and complete.    Gwyneth Sprout, [place] [phone] (phone) [phone] (fax)  [place]

## 2021-05-04 NOTE — Assessment & Plan Note (Signed)
Start with general lab work and thyroid checks

## 2021-05-04 NOTE — Assessment & Plan Note (Signed)
Denies concerns; due for mammogram

## 2021-05-04 NOTE — Assessment & Plan Note (Signed)
Has interfered with sleep Hx of hysterectomy in past- with hot flashes at that time

## 2021-05-04 NOTE — Assessment & Plan Note (Signed)
Potential for multiple sources Recommend journal to trend/note stress sources Plan for CPE Start SSRI for hot flashes- may assist with anxiety Notes anxiety in her son as well

## 2021-05-04 NOTE — Assessment & Plan Note (Signed)
A1c pending.

## 2021-05-05 ENCOUNTER — Other Ambulatory Visit: Payer: Self-pay | Admitting: Family Medicine

## 2021-05-05 DIAGNOSIS — E039 Hypothyroidism, unspecified: Secondary | ICD-10-CM

## 2021-05-05 LAB — LIPID PANEL
Chol/HDL Ratio: 5.9 ratio — ABNORMAL HIGH (ref 0.0–4.4)
Cholesterol, Total: 275 mg/dL — ABNORMAL HIGH (ref 100–199)
HDL: 47 mg/dL (ref >39)
LDL Chol Calc (NIH): 153 mg/dL — ABNORMAL HIGH (ref 0–99)
Triglycerides: 399 mg/dL — ABNORMAL HIGH (ref 0–149)
VLDL Cholesterol Cal: 75 mg/dL — ABNORMAL HIGH (ref 5–40)

## 2021-05-05 LAB — COMPREHENSIVE METABOLIC PANEL
ALT: 19 IU/L (ref 0–32)
AST: 18 IU/L (ref 0–40)
Albumin/Globulin Ratio: 2.1 (ref 1.2–2.2)
Albumin: 4.7 g/dL (ref 3.8–4.9)
Alkaline Phosphatase: 107 IU/L (ref 44–121)
BUN/Creatinine Ratio: 28 — ABNORMAL HIGH (ref 9–23)
BUN: 23 mg/dL (ref 6–24)
Bilirubin Total: 0.3 mg/dL (ref 0.0–1.2)
CO2: 23 mmol/L (ref 20–29)
Calcium: 9.5 mg/dL (ref 8.7–10.2)
Chloride: 102 mmol/L (ref 96–106)
Creatinine, Ser: 0.82 mg/dL (ref 0.57–1.00)
Globulin, Total: 2.2 g/dL (ref 1.5–4.5)
Glucose: 111 mg/dL — ABNORMAL HIGH (ref 70–99)
Potassium: 4.6 mmol/L (ref 3.5–5.2)
Sodium: 139 mmol/L (ref 134–144)
Total Protein: 6.9 g/dL (ref 6.0–8.5)
eGFR: 84 mL/min/{1.73_m2} (ref >59)

## 2021-05-05 LAB — TSH+FREE T4
Free T4: 1.12 ng/dL (ref 0.82–1.77)
TSH: 1.31 u[IU]/mL (ref 0.450–4.500)

## 2021-05-05 LAB — HEPATITIS C ANTIBODY: Hep C Virus Ab: <0.1 s/co ratio (ref 0.0–0.9)

## 2021-05-05 LAB — HEMOGLOBIN A1C
Est. average glucose Bld gHb Est-mCnc: 123 mg/dL
Hgb A1c MFr Bld: 5.9 % — ABNORMAL HIGH (ref 4.8–5.6)

## 2021-05-05 MED ORDER — LEVOTHYROXINE SODIUM 88 MCG PO TABS: ORAL_TABLET | ORAL | 3 refills | Status: DC

## 2021-05-05 MED ORDER — ROSUVASTATIN CALCIUM 10 MG PO TABS: 10.0000 mg | ORAL_TABLET | Freq: Every day | ORAL | 3 refills | Status: DC

## 2021-05-19 ENCOUNTER — Ambulatory Visit
Admission: RE | Admit: 2021-05-19 | Discharge: 2021-05-19 | Disposition: A | Discharge status: Home / Self Care | Payer: 59 | Source: Ambulatory Visit | Attending: Family Medicine | Admitting: Family Medicine

## 2021-05-19 ENCOUNTER — Other Ambulatory Visit: Payer: Self-pay

## 2021-05-19 DIAGNOSIS — F4322 Adjustment disorder with anxiety: Secondary | ICD-10-CM | POA: Insufficient documentation

## 2021-05-19 DIAGNOSIS — L659 Nonscarring hair loss, unspecified: Secondary | ICD-10-CM | POA: Diagnosis present

## 2021-05-19 DIAGNOSIS — E049 Nontoxic goiter, unspecified: Secondary | ICD-10-CM | POA: Insufficient documentation

## 2021-05-19 DIAGNOSIS — E039 Hypothyroidism, unspecified: Secondary | ICD-10-CM | POA: Insufficient documentation

## 2021-05-19 DIAGNOSIS — R5382 Chronic fatigue, unspecified: Secondary | ICD-10-CM | POA: Insufficient documentation

## 2021-05-27 ENCOUNTER — Telehealth: Payer: Self-pay | Admitting: Family Medicine

## 2021-05-27 NOTE — Telephone Encounter (Signed)
Patient called, left VM to return the call to the office for lab results. Results as below will need to be given to the patient, released in MyChart by provider.   Hi Mary Miller,   At this time your labs are holding strong; however, your body is working against itself.   Atrophic and markedly heterogeneous thyroid gland as could be seen with chronic stages of thyroiditis or autoimmune thyroid disorder. This is commonly referred to as Hashimoto. We don't treat until the thyroid begins to fail.   Please let us know if you have any questions.   Thank you,   Tally Joe, FNP

## 2021-05-27 NOTE — Telephone Encounter (Signed)
Patient called in about ultrasound results for thyroid. Please call back

## 2021-06-01 ENCOUNTER — Telehealth: Payer: Self-pay | Admitting: Family Medicine

## 2021-06-01 NOTE — Telephone Encounter (Signed)
Reviewed over lab results with patient. KW

## 2021-06-01 NOTE — Telephone Encounter (Signed)
Pt would like to know what she is to do in the meantime about her thyroid issues? Pt does not quite understand the message in mychart and would appreciate a call back to discuss.

## 2021-07-28 ENCOUNTER — Telehealth: Payer: Self-pay | Admitting: Family Medicine

## 2021-07-28 DIAGNOSIS — E039 Hypothyroidism, unspecified: Secondary | ICD-10-CM

## 2021-07-28 MED ORDER — LEVOTHYROXINE SODIUM 88 MCG PO TABS: ORAL_TABLET | ORAL | 1 refills | Status: DC

## 2021-07-28 NOTE — Telephone Encounter (Signed)
[facility] faxed refill request for the following medications: ? ?levothyroxine (SYNTHROID) 88 MCG tablet  ? ?Please advise. ? ?

## 2021-08-02 ENCOUNTER — Telehealth: Payer: Self-pay

## 2021-08-02 ENCOUNTER — Other Ambulatory Visit: Payer: Self-pay

## 2021-08-02 DIAGNOSIS — F32A Depression, unspecified: Secondary | ICD-10-CM

## 2021-08-02 DIAGNOSIS — F419 Anxiety disorder, unspecified: Secondary | ICD-10-CM

## 2021-08-02 DIAGNOSIS — G4700 Insomnia, unspecified: Secondary | ICD-10-CM

## 2021-08-02 MED ORDER — TRAZODONE HCL 100 MG PO TABS: ORAL_TABLET | ORAL | 3 refills | Status: DC

## 2021-08-02 NOTE — Telephone Encounter (Signed)
[facility] faxed refill request for the following medications:   traZODone (DESYREL) 100 MG tablet    Please advise.  

## 2021-12-27 NOTE — Progress Notes (Unsigned)
I,Sulibeya S Dimas,acting as a Education administrator for Gwyneth Sprout, FNP.,have documented all relevant documentation on the behalf of Gwyneth Sprout, FNP,as directed by  Gwyneth Sprout, FNP while in the presence of Gwyneth Sprout, FNP.   Established patient visit   Patient: Mary Miller   DOB: 02-15-1966   56 y.o. Female  MRN: [id number] Visit Date: 12/28/2021  Today's healthcare provider: Gwyneth Sprout, FNP  Introduced to nurse practitioner role and practice setting.  All questions answered.  Discussed provider/patient relationship and expectations.   Chief Complaint  Patient presents with   Arm Pain   Hypothyroidism   Subjective    Arm Pain  There was no injury mechanism. The pain is present in the upper left arm. The quality of the pain is described as aching. The pain does not radiate. The pain is severe. The pain has been Fluctuating since the incident. Associated symptoms include muscle weakness, numbness and tingling. Pertinent negatives include no chest pain. The symptoms are aggravated by movement and lifting. She has tried ice and NSAIDs for the symptoms. The treatment provided moderate relief.       Shoulder: left    Inspection:  no swelling, ecchymosis, erythema or step off deformity.  Swelling: no   Ecchymosis: no   Erythema: no   Deformity: none       Range of Motion:  decreased ROM behind back; normal ROM across chest; pulling with straight arm raise    Abduction:Decreased    Adduction: Normal    Flexion: Decreased    Extension: Decreased    Internal rotation: Decreased    External rotation: Decreased    Painful arc: yes     Muscle Strength: 5/5 bilaterally    Flexion: Normal    Extension: Decreased    Abduction: Decreased    Adduction: Decreased    External rotation: Decreased    Internal rotation: Decreased     Neuro: Sensation WNL. and Upper extremity reflexes WNL.     Special Tests:     Neer sign: Positive    Hawkins sign: Positive    Cross arm  adduction: Positive    Yergason sign: Positive    O'brien sign: Negative     Speed sign: Positive  SHOULDER PAIN Duration: weeks Involved shoulder: left Mechanism of injury: unknown Location: diffuse Onset:gradual Severity: severe  Quality:  weakness, numb, tingling  Frequency: intermittent Radiation: no Aggravating factors: lifting, movement, and sleep  Alleviating factors: ice, APAP, and NSAIDs  Status: worse and fluctuating Treatments attempted: rest, ice, APAP, ibuprofen, and aleve  Relief with NSAIDs?:  mild Weakness: yes Numbness: yes Decreased grip strength: no Redness: no Swelling: no Bruising: no Fevers: no  Hypothyroid, follow-up  Lab Results  Component Value Date   TSH 1.310 05/04/2021   TSH 1.240 03/30/2020   TSH 2.370 01/30/2019   FREET4 1.12 05/04/2021    Wt Readings from Last 3 Encounters:  12/28/21 154 lb 6.4 oz (70 kg)  05/04/21 147 lb 6.4 oz (66.9 kg)  11/12/20 150 lb (68 kg)    She was last seen for hypothyroid 8 months ago.  Management since that visit includes no changes. She reports excellent compliance with treatment. She is not having side effects.   Symptoms: No change in energy level No constipation  No diarrhea Yes heat / cold intolerance  No nervousness No palpitations  No weight changes    -----------------------------------------------------------------------------------------  Medications: Outpatient Medications Prior to Visit  Medication Sig  ibuprofen (ADVIL) 800 MG tablet Take 1 tablet (800 mg total) by mouth every 8 (eight) hours as needed.   levothyroxine (SYNTHROID) 88 MCG tablet TAKE 1 TABLET(88 MCG) BY MOUTH DAILY   rosuvastatin (CRESTOR) 10 MG tablet Take 1 tablet (10 mg total) by mouth daily.   traZODone (DESYREL) 100 MG tablet TAKE 1 TABLET BY MOUTH AT BEDTIME AS NEEDED FOR SLEEP   [DISCONTINUED] PARoxetine (PAXIL) 20 MG tablet Take 1 tablet (20 mg total) by mouth at bedtime.   No facility-administered  medications prior to visit.    Review of Systems  Constitutional:  Positive for diaphoresis. Negative for activity change, appetite change, chills, fatigue and unexpected weight change.  Respiratory:  Positive for cough. Negative for chest tightness and shortness of breath.   Cardiovascular:  Negative for chest pain, palpitations and leg swelling.  Gastrointestinal:  Negative for abdominal pain, constipation, diarrhea, nausea and vomiting.  Neurological:  Positive for tingling, weakness and numbness.        Objective    BP 129/80 (BP Location: Right Arm, Patient Position: Sitting, Cuff Size: Large)   Pulse 69   Temp 98.2 F (36.8 C) (Oral)   Resp 16   Ht '5\' 2"'$  (1.575 m)   Wt 154 lb 6.4 oz (70 kg)   SpO2 97%   BMI 28.24 kg/m   BP Readings from Last 3 Encounters:  12/28/21 129/80  05/04/21 121/82  11/12/20 (!) 160/91   Wt Readings from Last 3 Encounters:  12/28/21 154 lb 6.4 oz (70 kg)  05/04/21 147 lb 6.4 oz (66.9 kg)  11/12/20 150 lb (68 kg)      Physical Exam Vitals and nursing note reviewed.  Constitutional:      General: She is not in acute distress.    Appearance: Normal appearance. She is overweight. She is not ill-appearing, toxic-appearing or diaphoretic.  HENT:     Head: Normocephalic and atraumatic.  Cardiovascular:     Rate and Rhythm: Normal rate and regular rhythm.     Pulses: Normal pulses.     Heart sounds: Normal heart sounds. No murmur heard.    No friction rub. No gallop.     Comments: Denies CP, skipped, missed beats Pulmonary:     Effort: Pulmonary effort is normal. No respiratory distress.     Breath sounds: Normal breath sounds. No stridor. No wheezing, rhonchi or rales.  Chest:     Chest wall: No tenderness.  Abdominal:     General: Bowel sounds are normal.     Palpations: Abdomen is soft.  Musculoskeletal:        General: Tenderness and signs of injury present. No swelling or deformity. Normal range of motion.     Cervical back:  Normal range of motion and neck supple. No tenderness.     Right lower leg: No edema.     Left lower leg: No edema.     Comments: L shoulder/biceps- refer to Ortho  Skin:    General: Skin is warm and dry.     Capillary Refill: Capillary refill takes less than 2 seconds.     Coloration: Skin is not jaundiced or pale.     Findings: No bruising, erythema, lesion or rash.  Neurological:     General: No focal deficit present.     Mental Status: She is alert and oriented to person, place, and time. Mental status is at baseline.     Cranial Nerves: No cranial nerve deficit.     Sensory: No  sensory deficit.     Motor: No weakness.     Coordination: Coordination normal.  Psychiatric:        Mood and Affect: Mood normal.        Behavior: Behavior normal.        Thought Content: Thought content normal.        Judgment: Judgment normal.      No results found for any visits on 12/28/21.  Assessment & Plan     Problem List Items Addressed This Visit       Endocrine   Adult hypothyroidism    Chronic, previously stable On 88 mcg/daily Endorses hot flashes; chronic Endorses weight gain Endorses insomnia Normal exam; repeat labs Hold refill until labs return      Relevant Orders   TSH+T4F+T3Free     Other   Acute pain of left shoulder - Primary    Acute, variable Appears shoulder impingement/bicep strain Refer to Ortho for further care Trial of muscle relaxants and high dose NSAIDs      Relevant Medications   meloxicam (MOBIC) 15 MG tablet   methocarbamol (ROBAXIN-750) 750 MG tablet   Other Relevant Orders   Ambulatory referral to Orthopedic Surgery   Elevated LDL cholesterol level    Chronic, unstable Previously provided with Crestor Rx; patient did not take Repeat labs today recommend diet low in saturated fat and regular exercise - 30 min at least 5 times per week       Relevant Orders   Lipid panel   Elevated serum glucose    Repeat CMP  Denies changes in  thirst, urination, diet. Continue to recommend balanced, lower carb meals. Smaller meal size, adding snacks. Choosing water as drink of choice and increasing purposeful exercise.       Relevant Orders   Comprehensive metabolic panel   Need for shingles vaccine    Consented; VIS made available. No acute reaction s/p immunization. .      Relevant Orders   Varicella-zoster vaccine IM (Completed)   Need for Tdap vaccination    Consented; VIS made available. No acute reaction s/p immunization.      Relevant Orders   Tdap vaccine greater than or equal to 7yo IM (Completed)     Return if symptoms worsen or fail to improve.      Vonna Kotyk, FNP, have reviewed all documentation for this visit. The documentation on 12/28/21 for the exam, diagnosis, procedures, and orders are all accurate and complete.    Gwyneth Sprout, [place] [phone] (phone) [phone] (fax)  [place]

## 2021-12-28 ENCOUNTER — Encounter: Payer: Self-pay | Admitting: Family Medicine

## 2021-12-28 ENCOUNTER — Ambulatory Visit (INDEPENDENT_AMBULATORY_CARE_PROVIDER_SITE_OTHER): Payer: 59 | Admitting: Family Medicine

## 2021-12-28 VITALS — BP 129/80 | HR 69 | Temp 98.2°F | Resp 16 | Ht 62.0 in | Wt 154.4 lb

## 2021-12-28 DIAGNOSIS — E039 Hypothyroidism, unspecified: Secondary | ICD-10-CM

## 2021-12-28 DIAGNOSIS — E7800 Pure hypercholesterolemia, unspecified: Secondary | ICD-10-CM

## 2021-12-28 DIAGNOSIS — M25512 Pain in left shoulder: Secondary | ICD-10-CM | POA: Diagnosis not present

## 2021-12-28 DIAGNOSIS — R739 Hyperglycemia, unspecified: Secondary | ICD-10-CM

## 2021-12-28 DIAGNOSIS — Z23 Encounter for immunization: Secondary | ICD-10-CM

## 2021-12-28 MED ORDER — METHOCARBAMOL 750 MG PO TABS: 750.0000 mg | ORAL_TABLET | Freq: Four times a day (QID) | ORAL | 0 refills | Status: DC

## 2021-12-28 MED ORDER — MELOXICAM 15 MG PO TABS: 15.0000 mg | ORAL_TABLET | Freq: Every day | ORAL | 0 refills | Status: DC

## 2021-12-28 NOTE — Assessment & Plan Note (Signed)
Consented; VIS made available. No acute reaction s/p immunization.

## 2021-12-28 NOTE — Assessment & Plan Note (Signed)
Chronic, previously stable On 31 mcg/daily Endorses hot flashes; chronic Endorses weight gain Endorses insomnia Normal exam; repeat labs Hold refill until labs return

## 2021-12-28 NOTE — Assessment & Plan Note (Signed)
Acute, variable Appears shoulder impingement/bicep strain Refer to Ortho for further care Trial of muscle relaxants and high dose NSAIDs

## 2021-12-28 NOTE — Assessment & Plan Note (Signed)
Repeat CMP  Denies changes in thirst, urination, diet. Continue to recommend balanced, lower carb meals. Smaller meal size, adding snacks. Choosing water as drink of choice and increasing purposeful exercise.

## 2021-12-28 NOTE — Assessment & Plan Note (Signed)
Chronic, unstable Previously provided with Crestor Rx; patient did not take Repeat labs today recommend diet low in saturated fat and regular exercise - 30 min at least 5 times per week

## 2021-12-28 NOTE — Assessment & Plan Note (Signed)
Consented; VIS made available. No acute reaction s/p immunization. Marland Kitchen

## 2021-12-29 ENCOUNTER — Other Ambulatory Visit: Payer: Self-pay | Admitting: Family Medicine

## 2021-12-29 DIAGNOSIS — E7800 Pure hypercholesterolemia, unspecified: Secondary | ICD-10-CM

## 2021-12-29 DIAGNOSIS — E782 Mixed hyperlipidemia: Secondary | ICD-10-CM

## 2021-12-29 LAB — LIPID PANEL
Chol/HDL Ratio: 7.1 ratio — ABNORMAL HIGH (ref 0.0–4.4)
Cholesterol, Total: 268 mg/dL — ABNORMAL HIGH (ref 100–199)
HDL: 38 mg/dL — ABNORMAL LOW (ref >39)
LDL Chol Calc (NIH): 125 mg/dL — ABNORMAL HIGH (ref 0–99)
Triglycerides: 579 mg/dL (ref 0–149)
VLDL Cholesterol Cal: 105 mg/dL — ABNORMAL HIGH (ref 5–40)

## 2021-12-29 LAB — COMPREHENSIVE METABOLIC PANEL
ALT: 19 IU/L (ref 0–32)
AST: 19 IU/L (ref 0–40)
Albumin/Globulin Ratio: 1.9 (ref 1.2–2.2)
Albumin: 4.3 g/dL (ref 3.8–4.9)
Alkaline Phosphatase: 89 IU/L (ref 44–121)
BUN/Creatinine Ratio: 18 (ref 9–23)
BUN: 18 mg/dL (ref 6–24)
Bilirubin Total: 0.2 mg/dL (ref 0.0–1.2)
CO2: 21 mmol/L (ref 20–29)
Calcium: 9 mg/dL (ref 8.7–10.2)
Chloride: 104 mmol/L (ref 96–106)
Creatinine, Ser: 0.99 mg/dL (ref 0.57–1.00)
Globulin, Total: 2.3 g/dL (ref 1.5–4.5)
Glucose: 103 mg/dL — ABNORMAL HIGH (ref 70–99)
Potassium: 4.2 mmol/L (ref 3.5–5.2)
Sodium: 140 mmol/L (ref 134–144)
Total Protein: 6.6 g/dL (ref 6.0–8.5)
eGFR: 67 mL/min/{1.73_m2} (ref >59)

## 2021-12-29 LAB — TSH+T4F+T3FREE
Free T4: 1.27 ng/dL (ref 0.82–1.77)
T3, Free: 2.8 pg/mL (ref 2.0–4.4)
TSH: 1.09 u[IU]/mL (ref 0.450–4.500)

## 2021-12-29 MED ORDER — ROSUVASTATIN CALCIUM 40 MG PO TABS: 40.0000 mg | ORAL_TABLET | Freq: Every day | ORAL | 3 refills | Status: DC

## 2021-12-29 MED ORDER — FENOFIBRATE 145 MG PO TABS: 145.0000 mg | ORAL_TABLET | Freq: Every day | ORAL | 3 refills | Status: DC

## 2021-12-29 NOTE — Progress Notes (Signed)
Slight improvement in LDL/bad cholesterol and total cholesterol. However, good/HDL cholesterol remains low and fats are extremely high. The 10-year ASCVD risk score (Arnett DK, et al., 2019) is: 9.9%   Values used to calculate the score:     Age: 56 years     Sex: Female     Is Non-Hispanic African American: No     Diabetic: No     Tobacco smoker: Yes     Systolic Blood Pressure: 242 mmHg     Is BP treated: No     HDL Cholesterol: 38 mg/dL     Total Cholesterol: 268 mg/dL Heart attack and stroke risk is 10% estimated within the next 10 years which is moderate-high. If you have been compliant with Crestor 10 mg; I recommend we increase strength to 40 mg and add fenofibrate to assist with fats/triglycerides.   Thyroid and blood chemistry are stable.  Gwyneth Sprout, [address] [place], [place] [address] [phone] (phone) [phone] (fax) [place]

## 2022-01-22 ENCOUNTER — Other Ambulatory Visit: Payer: Self-pay | Admitting: Family Medicine

## 2022-01-22 DIAGNOSIS — E039 Hypothyroidism, unspecified: Secondary | ICD-10-CM

## 2022-01-24 ENCOUNTER — Other Ambulatory Visit: Payer: Self-pay | Admitting: Family Medicine

## 2022-01-24 DIAGNOSIS — M25512 Pain in left shoulder: Secondary | ICD-10-CM

## 2022-02-21 ENCOUNTER — Other Ambulatory Visit: Payer: Self-pay | Admitting: Family Medicine

## 2022-02-21 DIAGNOSIS — M25512 Pain in left shoulder: Secondary | ICD-10-CM

## 2022-03-25 ENCOUNTER — Ambulatory Visit (INDEPENDENT_AMBULATORY_CARE_PROVIDER_SITE_OTHER): Payer: 59 | Admitting: Family Medicine

## 2022-03-25 ENCOUNTER — Encounter: Payer: Self-pay | Admitting: Family Medicine

## 2022-03-25 VITALS — BP 128/82 | HR 82 | Resp 16 | Ht 62.0 in | Wt 155.0 lb

## 2022-03-25 DIAGNOSIS — F439 Reaction to severe stress, unspecified: Secondary | ICD-10-CM | POA: Diagnosis not present

## 2022-03-25 MED ORDER — QUETIAPINE FUMARATE 50 MG PO TABS: ORAL_TABLET | ORAL | 1 refills | Status: DC

## 2022-03-25 NOTE — Assessment & Plan Note (Signed)
Stress in workplace since 1 month ago; exacerbated on Tuesday Has taken time off work since to assist Working on positive self talk However, continues to have worsening sleep patterns Encourage self care, journaling, exercise, etc Trial of seroquel to assist  RTC as needed

## 2022-03-25 NOTE — Patient Instructions (Signed)
Please call and schedule your mammogram:  [facility]  [address], [address] [facility] [place],  [place]  [address] Get Driving Directions Main: [phone]  Sunday:Closed Monday:7:20 AM - 5:00 PM Tuesday:7:20 AM - 5:00 PM Wednesday:7:20 AM - 5:00 PM Thursday:7:20 AM - 5:00 PM Friday:7:20 AM - 4:30 PM Saturday:Closed  

## 2022-03-25 NOTE — Progress Notes (Signed)
Established patient visit   Patient: Mary Miller   DOB: March 02, 1966   56 y.o. Female  MRN: [id number] Visit Date: 03/25/2022  Today's healthcare provider: Gwyneth Sprout, FNP re Introduced to nurse practitioner role and practice setting.  All questions answered.  Discussed provider/patient relationship and expectations.  I,Tiffany J Bragg,acting as a scribe for Gwyneth Sprout, FNP.,have documented all relevant documentation on the behalf of Gwyneth Sprout, FNP,as directed by  Gwyneth Sprout, FNP while in the presence of Gwyneth Sprout, FNP.   Chief Complaint  Patient presents with   Fatigue    Patient complains of feeling stressed from work that she thinks is effect thyroid. Feeling tired, doesn't feel like doing anything for 4 weeks.    Subjective    HPI HPI     Fatigue    Additional comments: Patient complains of feeling stressed from work that she thinks is effect thyroid. Feeling tired, doesn't feel like doing anything for 4 weeks.       Last edited by Smitty Knudsen, CMA on 03/25/2022 10:31 AM.      Medications: Outpatient Medications Prior to Visit  Medication Sig   fenofibrate (TRICOR) 145 MG tablet Take 1 tablet (145 mg total) by mouth daily.   levothyroxine (SYNTHROID) 88 MCG tablet TAKE 1 TABLET(88 MCG) BY MOUTH DAILY   meloxicam (MOBIC) 15 MG tablet TAKE 1 TABLET(15 MG) BY MOUTH DAILY   methocarbamol (ROBAXIN-750) 750 MG tablet Take 1 tablet (750 mg total) by mouth 4 (four) times daily.   rosuvastatin (CRESTOR) 40 MG tablet Take 1 tablet (40 mg total) by mouth daily.   traZODone (DESYREL) 100 MG tablet TAKE 1 TABLET BY MOUTH AT BEDTIME AS NEEDED FOR SLEEP   [DISCONTINUED] ibuprofen (ADVIL) 800 MG tablet Take 1 tablet (800 mg total) by mouth every 8 (eight) hours as needed. (Patient not taking: Reported on 03/25/2022)   No facility-administered medications prior to visit.    Review of Systems    Objective    BP 128/82 (BP Location: Right Arm, Patient  Position: Sitting, Cuff Size: Normal)   Pulse 82   Resp 16   Ht '5\' 2"'$  (1.575 m)   Wt 155 lb (70.3 kg)   BMI 28.35 kg/m   Physical Exam Vitals and nursing note reviewed.  Constitutional:      General: She is not in acute distress.    Appearance: Normal appearance. She is overweight. She is not ill-appearing, toxic-appearing or diaphoretic.  HENT:     Head: Normocephalic and atraumatic.  Cardiovascular:     Rate and Rhythm: Normal rate and regular rhythm.     Pulses: Normal pulses.     Heart sounds: Normal heart sounds. No murmur heard.    No friction rub. No gallop.  Pulmonary:     Effort: Pulmonary effort is normal. No respiratory distress.     Breath sounds: Normal breath sounds. No stridor. No wheezing, rhonchi or rales.  Chest:     Chest wall: No tenderness.  Abdominal:     General: Bowel sounds are normal.     Palpations: Abdomen is soft.  Musculoskeletal:        General: No swelling, tenderness, deformity or signs of injury. Normal range of motion.     Right lower leg: No edema.     Left lower leg: No edema.  Skin:    General: Skin is warm and dry.     Capillary Refill: Capillary refill takes less than  2 seconds.     Coloration: Skin is not jaundiced or pale.     Findings: No bruising, erythema, lesion or rash.  Neurological:     General: No focal deficit present.     Mental Status: She is alert and oriented to person, place, and time. Mental status is at baseline.     Cranial Nerves: No cranial nerve deficit.     Sensory: No sensory deficit.     Motor: No weakness.     Coordination: Coordination normal.  Psychiatric:        Mood and Affect: Mood normal. Affect is tearful.        Speech: Speech normal.        Behavior: Behavior normal. Behavior is cooperative.        Thought Content: Thought content normal. Thought content does not include homicidal or suicidal ideation. Thought content does not include homicidal or suicidal plan.        Judgment: Judgment  normal.     No results found for any visits on 03/25/22.  Assessment & Plan     Problem List Items Addressed This Visit       Other   Situational stress - Primary    Stress in workplace since 1 month ago; exacerbated on Tuesday Has taken time off work since to assist Working on positive self talk However, continues to have worsening sleep patterns Encourage self care, journaling, exercise, etc Trial of seroquel to assist  RTC as needed       Relevant Medications   QUEtiapine (SEROQUEL) 50 MG tablet   Return if symptoms worsen or fail to improve.     Vonna Kotyk, FNP, have reviewed all documentation for this visit. The documentation on 03/25/22 for the exam, diagnosis, procedures, and orders are all accurate and complete.  Gwyneth Sprout, [place] [phone] (phone) [phone] (fax)  [place]

## 2022-03-31 ENCOUNTER — Other Ambulatory Visit: Payer: Self-pay | Admitting: Family Medicine

## 2022-03-31 DIAGNOSIS — M25512 Pain in left shoulder: Secondary | ICD-10-CM

## 2022-07-24 ENCOUNTER — Other Ambulatory Visit: Payer: Self-pay | Admitting: Family Medicine

## 2022-07-24 DIAGNOSIS — E039 Hypothyroidism, unspecified: Secondary | ICD-10-CM

## 2022-07-25 NOTE — Telephone Encounter (Signed)
Requested Prescriptions  Pending Prescriptions Disp Refills   levothyroxine (SYNTHROID) 88 MCG tablet [Pharmacy Med Name: LEVOTHYROXINE 0.'088MG'$  (88MCG) TAB] 90 tablet 1    Sig: TAKE 1 TABLET(88 MCG) BY MOUTH DAILY     Endocrinology:  Hypothyroid Agents Passed - 07/24/2022 10:30 AM      Passed - TSH in normal range and within 360 days    TSH  Date Value Ref Range Status  12/28/2021 1.090 0.450 - 4.500 uIU/mL Final         Passed - Valid encounter within last 12 months    Recent Outpatient Visits           4 months ago [place] Tally Joe T, FNP   6 months ago Acute pain of left shoulder   [place] Gwyneth Sprout, FNP   1 year ago Adult hypothyroidism   [place] Gwyneth Sprout, [place]   1 year ago Annual physical exam   [place], PA-C   1 year ago RUQ abdominal pain   [place], [place]

## 2022-08-01 ENCOUNTER — Encounter: Payer: Self-pay | Admitting: Family Medicine

## 2022-08-01 ENCOUNTER — Ambulatory Visit (INDEPENDENT_AMBULATORY_CARE_PROVIDER_SITE_OTHER): Payer: 59 | Admitting: Family Medicine

## 2022-08-01 VITALS — BP 156/90 | HR 79 | Ht 62.0 in | Wt 156.7 lb

## 2022-08-01 DIAGNOSIS — Z23 Encounter for immunization: Secondary | ICD-10-CM

## 2022-08-01 DIAGNOSIS — R030 Elevated blood-pressure reading, without diagnosis of hypertension: Secondary | ICD-10-CM

## 2022-08-01 DIAGNOSIS — R600 Localized edema: Secondary | ICD-10-CM | POA: Diagnosis not present

## 2022-08-01 DIAGNOSIS — Z1231 Encounter for screening mammogram for malignant neoplasm of breast: Secondary | ICD-10-CM

## 2022-08-01 DIAGNOSIS — E039 Hypothyroidism, unspecified: Secondary | ICD-10-CM

## 2022-08-01 DIAGNOSIS — E7800 Pure hypercholesterolemia, unspecified: Secondary | ICD-10-CM

## 2022-08-01 DIAGNOSIS — E782 Mixed hyperlipidemia: Secondary | ICD-10-CM | POA: Diagnosis not present

## 2022-08-01 DIAGNOSIS — R7303 Prediabetes: Secondary | ICD-10-CM

## 2022-08-01 NOTE — Assessment & Plan Note (Signed)
BP elevated x2; pt reports some anxiety regarding her facial/neck swelling and tenderness; declines use of recent tobacco prior to visit

## 2022-08-01 NOTE — Assessment & Plan Note (Signed)
Previously elevated LDL Repeat LP recommend diet low in saturated fat and regular exercise - 30 min at least 5 times per week

## 2022-08-01 NOTE — Assessment & Plan Note (Signed)
Due for screening for mammogram, denies breast concerns, provided with phone number to call and schedule appointment for mammogram. Encouraged to repeat breast cancer screening every 1-2 years.  

## 2022-08-01 NOTE — Assessment & Plan Note (Signed)
Chronic, previously stable Reports thyroid and neck/cheek pain Will repeat labs Will re-ultrasound given vague area Also endorses palpitations, fatigue, and acute elevation in BP today

## 2022-08-01 NOTE — Progress Notes (Signed)
I,Sha'taria Tyson,acting as a Education administrator for Gwyneth Sprout, FNP.,have documented all relevant documentation on the behalf of Gwyneth Sprout, FNP,as directed by  Gwyneth Sprout, FNP while in the presence of Gwyneth Sprout, FNP.   Established patient visit  Patient: Mary Miller   DOB: Mar 06, 1966   57 y.o. Female  MRN: [id number] Visit Date: 08/01/2022  Today's healthcare provider: Gwyneth Sprout, FNP  Re Introduced to nurse practitioner role and practice setting.  All questions answered.  Discussed provider/patient relationship and expectations.  Subjective    HPI  Edema: Patient complains of edema. The location of the edema is neck, and right side of jaw.  The edema has been localized.  Onset of symptoms was a few weeks ago, on and off since that time. The edema is present all day. The patient states never.  The swelling has been aggravated by nothing, relieved by  ice packs , and been associated with nothing. Cardiac risk factors include none.  Medications: Outpatient Medications Prior to Visit  Medication Sig   fenofibrate (TRICOR) 145 MG tablet Take 1 tablet (145 mg total) by mouth daily.   levothyroxine (SYNTHROID) 88 MCG tablet TAKE 1 TABLET(88 MCG) BY MOUTH DAILY   meloxicam (MOBIC) 15 MG tablet TAKE 1 TABLET(15 MG) BY MOUTH DAILY   methocarbamol (ROBAXIN-750) 750 MG tablet Take 1 tablet (750 mg total) by mouth 4 (four) times daily.   QUEtiapine (SEROQUEL) 50 MG tablet Take 1-2 tablets PO before bed to assist with mood disturbance from acute stressors   rosuvastatin (CRESTOR) 40 MG tablet Take 1 tablet (40 mg total) by mouth daily.   traZODone (DESYREL) 100 MG tablet TAKE 1 TABLET BY MOUTH AT BEDTIME AS NEEDED FOR SLEEP   No facility-administered medications prior to visit.    Review of Systems  Last CBC Lab Results  Component Value Date   WBC 8.1 10/13/2020   HGB 15.0 10/13/2020   HCT 44.2 10/13/2020   MCV 94.8 10/13/2020   MCH 32.2 10/13/2020   RDW 13.2 10/13/2020   PLT  [id number]   Last metabolic panel Lab Results  Component Value Date   GLUCOSE 103 (H) 12/28/2021   NA 140 12/28/2021   K 4.2 12/28/2021   CL 104 12/28/2021   CO2 21 12/28/2021   BUN 18 12/28/2021   CREATININE 0.99 12/28/2021   EGFR 67 12/28/2021   CALCIUM 9.0 12/28/2021   PROT 6.6 12/28/2021   ALBUMIN 4.3 12/28/2021   LABGLOB 2.3 12/28/2021   AGRATIO 1.9 12/28/2021   BILITOT 0.2 12/28/2021   ALKPHOS 89 12/28/2021   AST 19 12/28/2021   ALT 19 12/28/2021   ANIONGAP 9 10/13/2020   Last lipids Lab Results  Component Value Date   CHOL 268 (H) 12/28/2021   HDL 38 (L) 12/28/2021   LDLCALC 125 (H) 12/28/2021   LDLDIRECT 176 (H) 01/30/2019   TRIG 579 (HH) 12/28/2021   CHOLHDL 7.1 (H) 12/28/2021   Last hemoglobin A1c Lab Results  Component Value Date   HGBA1C 5.9 (H) 05/04/2021   Last thyroid functions Lab Results  Component Value Date   TSH 1.090 12/28/2021     Objective    BP (!) 156/90   Pulse 79   Ht '5\' 2"'$  (1.575 m)   Wt 156 lb 11.2 oz (71.1 kg)   SpO2 100%   BMI 28.66 kg/m   BP Readings from Last 3 Encounters:  08/01/22 (!) 156/90  03/25/22 128/82  12/28/21 129/80  Wt Readings from Last 3 Encounters:  08/01/22 156 lb 11.2 oz (71.1 kg)  03/25/22 155 lb (70.3 kg)  12/28/21 154 lb 6.4 oz (70 kg)   SpO2 Readings from Last 3 Encounters:  08/01/22 100%  12/28/21 97%  11/12/20 97%   Physical Exam Vitals and nursing note reviewed.  Constitutional:      General: She is not in acute distress.    Appearance: Normal appearance. She is overweight. She is not ill-appearing, toxic-appearing or diaphoretic.  HENT:     Head: Normocephalic and atraumatic.     Right Ear: Tympanic membrane, ear canal and external ear normal.     Left Ear: Tympanic membrane, ear canal and external ear normal.     Nose: Nose normal.     Mouth/Throat:     Mouth: Mucous membranes are moist.     Pharynx: Oropharynx is clear.  Eyes:     Conjunctiva/sclera: Conjunctivae normal.   Neck:     Thyroid: Thyromegaly and thyroid tenderness present.     Trachea: Trachea and phonation normal.   Cardiovascular:     Rate and Rhythm: Normal rate and regular rhythm.     Pulses: Normal pulses.     Heart sounds: Normal heart sounds. No murmur heard.    No friction rub. No gallop.  Pulmonary:     Effort: Pulmonary effort is normal. No respiratory distress.     Breath sounds: Normal breath sounds. No stridor. No wheezing, rhonchi or rales.  Chest:     Chest wall: No tenderness.  Musculoskeletal:        General: No swelling, deformity or signs of injury. Normal range of motion.     Cervical back: Full passive range of motion without pain and normal range of motion. Edema and tenderness present.     Right lower leg: No edema.     Left lower leg: No edema.  Lymphadenopathy:     Cervical: No cervical adenopathy.  Skin:    General: Skin is warm and dry.     Capillary Refill: Capillary refill takes less than 2 seconds.     Coloration: Skin is not jaundiced or pale.     Findings: No bruising, erythema, lesion or rash.  Neurological:     General: No focal deficit present.     Mental Status: She is alert and oriented to person, place, and time. Mental status is at baseline.     Cranial Nerves: No cranial nerve deficit.     Sensory: No sensory deficit.     Motor: No weakness.     Coordination: Coordination normal.  Psychiatric:        Mood and Affect: Mood normal.        Behavior: Behavior normal.        Thought Content: Thought content normal.        Judgment: Judgment normal.     No results found for any visits on 08/01/22.  Assessment & Plan     Problem List Items Addressed This Visit       Endocrine   Adult hypothyroidism    Chronic, previously stable Reports thyroid and neck/cheek pain Will repeat labs Will re-ultrasound given vague area Also endorses palpitations, fatigue, and acute elevation in BP today       Relevant Orders   TSH+T4F+T3Free   US  THYROID   US Soft Tissue Head/Neck (NON-THYROID)     Other   Elevated blood pressure reading in office without diagnosis of hypertension  BP elevated x2; pt reports some anxiety regarding her facial/neck swelling and tenderness; declines use of recent tobacco prior to visit       Elevated LDL cholesterol level    Previously elevated LDL Repeat LP recommend diet low in saturated fat and regular exercise - 30 min at least 5 times per week       Relevant Orders   Lipid panel   Elevated triglycerides with high cholesterol    Previously elevated with elevated fats too Repeat LP Stable on crestor 40 and tricor 145 mg       Encounter for screening mammogram for malignant neoplasm of breast    Due for screening for mammogram, denies breast concerns, provided with phone number to call and schedule appointment for mammogram. Encouraged to repeat breast cancer screening every 1-2 years.       Relevant Orders   MM 3D SCREENING MAMMOGRAM BILATERAL BREAST   Facial edema - Primary    Chronic x 3 months Will repeat labs and imaging at this time       Relevant Orders   CBC with Differential/Platelet   Comprehensive Metabolic Panel (CMET)   US THYROID   US Soft Tissue Head/Neck (NON-THYROID)   Need for shingles vaccine    Consented; VIS made available; no immediate side effects following administration       Relevant Orders   Zoster Recombinant (Shingrix ) (Completed)   Prediabetes    Chronic, stable Repeat A1c Continue to recommend balanced, lower carb meals. Smaller meal size, adding snacks. Choosing water as drink of choice and increasing purposeful exercise.       Relevant Orders   Hemoglobin A1c   Return if symptoms worsen or fail to improve.     Vonna Kotyk, FNP, have reviewed all documentation for this visit. The documentation on 08/01/22 for the exam, diagnosis, procedures, and orders are all accurate and complete.  Gwyneth Sprout, [place] [phone] (phone) [phone] (fax)  [place]

## 2022-08-01 NOTE — Assessment & Plan Note (Signed)
Chronic x 3 months Will repeat labs and imaging at this time

## 2022-08-01 NOTE — Assessment & Plan Note (Signed)
Consented; VIS made available; no immediate side effects following administration  

## 2022-08-01 NOTE — Patient Instructions (Addendum)
Please let us know if you do not hear from [place] within 1-2 business weeks  Please contact [phone] to schedule your mammogram. You will be asked your location preference to have procedure performed. You have two options listed below.  1) [facility] located at [employer], [place] [address] 2) [facility] located at [address] [place], [place] [address]  Upon results being received our office will contact you. As well as all results can be viewed through your MyChart. Please feel free to contact us if you have any further questions or concerns.

## 2022-08-01 NOTE — Assessment & Plan Note (Signed)
Previously elevated with elevated fats too Repeat LP Stable on crestor 40 and tricor 145 mg

## 2022-08-01 NOTE — Assessment & Plan Note (Signed)
Chronic, stable Repeat A1c Continue to recommend balanced, lower carb meals. Smaller meal size, adding snacks. Choosing water as drink of choice and increasing purposeful exercise.  

## 2022-08-02 LAB — CBC WITH DIFFERENTIAL/PLATELET
Basophils Absolute: 0.1 10*3/uL (ref 0.0–0.2)
Basos: 1 %
EOS (ABSOLUTE): 0.2 10*3/uL (ref 0.0–0.4)
Eos: 2 %
Hematocrit: 43.9 % (ref 34.0–46.6)
Hemoglobin: 15.5 g/dL (ref 11.1–15.9)
Immature Grans (Abs): 0.1 10*3/uL (ref 0.0–0.1)
Immature Granulocytes: 1 %
Lymphocytes Absolute: 2.8 10*3/uL (ref 0.7–3.1)
Lymphs: 28 %
MCH: 33.2 pg — ABNORMAL HIGH (ref 26.6–33.0)
MCHC: 35.3 g/dL (ref 31.5–35.7)
MCV: 94 fL (ref 79–97)
Monocytes Absolute: 0.7 10*3/uL (ref 0.1–0.9)
Monocytes: 6 %
Neutrophils Absolute: 6.4 10*3/uL (ref 1.4–7.0)
Neutrophils: 62 %
Platelets: 335 10*3/uL (ref 150–450)
RBC: 4.67 x10E6/uL (ref 3.77–5.28)
RDW: 12.5 % (ref 11.7–15.4)
WBC: 10.1 10*3/uL (ref 3.4–10.8)

## 2022-08-02 LAB — COMPREHENSIVE METABOLIC PANEL
ALT: 39 IU/L — ABNORMAL HIGH (ref 0–32)
AST: 35 IU/L (ref 0–40)
Albumin/Globulin Ratio: 2 (ref 1.2–2.2)
Albumin: 4.5 g/dL (ref 3.8–4.9)
Alkaline Phosphatase: 113 IU/L (ref 44–121)
BUN/Creatinine Ratio: 19 (ref 9–23)
BUN: 19 mg/dL (ref 6–24)
Bilirubin Total: 0.3 mg/dL (ref 0.0–1.2)
CO2: 21 mmol/L (ref 20–29)
Calcium: 9.5 mg/dL (ref 8.7–10.2)
Chloride: 105 mmol/L (ref 96–106)
Creatinine, Ser: 1.01 mg/dL — ABNORMAL HIGH (ref 0.57–1.00)
Globulin, Total: 2.3 g/dL (ref 1.5–4.5)
Glucose: 145 mg/dL — ABNORMAL HIGH (ref 70–99)
Potassium: 4.3 mmol/L (ref 3.5–5.2)
Sodium: 139 mmol/L (ref 134–144)
Total Protein: 6.8 g/dL (ref 6.0–8.5)
eGFR: 65 mL/min/{1.73_m2} (ref >59)

## 2022-08-02 LAB — LIPID PANEL
Chol/HDL Ratio: 7.6 ratio — ABNORMAL HIGH (ref 0.0–4.4)
Cholesterol, Total: 290 mg/dL — ABNORMAL HIGH (ref 100–199)
HDL: 38 mg/dL — ABNORMAL LOW (ref >39)
LDL Chol Calc (NIH): 110 mg/dL — ABNORMAL HIGH (ref 0–99)
Triglycerides: 798 mg/dL (ref 0–149)
VLDL Cholesterol Cal: 142 mg/dL — ABNORMAL HIGH (ref 5–40)

## 2022-08-02 LAB — HEMOGLOBIN A1C
Est. average glucose Bld gHb Est-mCnc: 137 mg/dL
Hgb A1c MFr Bld: 6.4 % — ABNORMAL HIGH (ref 4.8–5.6)

## 2022-08-02 LAB — TSH+T4F+T3FREE
Free T4: 1.15 ng/dL (ref 0.82–1.77)
T3, Free: 2.5 pg/mL (ref 2.0–4.4)
TSH: 1.32 u[IU]/mL (ref 0.450–4.500)

## 2022-08-04 ENCOUNTER — Ambulatory Visit
Admission: RE | Admit: 2022-08-04 | Discharge: 2022-08-04 | Disposition: A | Discharge status: Home / Self Care | Payer: 59 | Source: Ambulatory Visit | Attending: Family Medicine | Admitting: Family Medicine

## 2022-08-04 DIAGNOSIS — R600 Localized edema: Secondary | ICD-10-CM | POA: Diagnosis not present

## 2022-08-04 DIAGNOSIS — E039 Hypothyroidism, unspecified: Secondary | ICD-10-CM | POA: Insufficient documentation

## 2022-08-09 NOTE — Progress Notes (Signed)
Normal/stable [place].

## 2022-11-10 ENCOUNTER — Other Ambulatory Visit: Payer: Self-pay | Admitting: Family Medicine

## 2022-11-10 DIAGNOSIS — E039 Hypothyroidism, unspecified: Secondary | ICD-10-CM

## 2023-02-17 ENCOUNTER — Other Ambulatory Visit: Payer: Self-pay | Admitting: Family Medicine

## 2023-02-17 DIAGNOSIS — E039 Hypothyroidism, unspecified: Secondary | ICD-10-CM

## 2023-02-17 MED ORDER — LEVOTHYROXINE SODIUM 88 MCG PO TABS: ORAL_TABLET | ORAL | 0 refills | Status: DC

## 2023-02-17 NOTE — Telephone Encounter (Signed)
Requested Prescriptions  Pending Prescriptions Disp Refills   levothyroxine (SYNTHROID) 88 MCG tablet 90 tablet 0    Sig: TAKE 1 TABLET(88 MCG) BY MOUTH DAILY     Endocrinology:  Hypothyroid Agents Passed - 02/17/2023 12:27 PM      Passed - TSH in normal range and within 360 days    TSH  Date Value Ref Range Status  08/01/2022 1.320 0.450 - 4.500 uIU/mL Final         Passed - Valid encounter within last 12 months    Recent Outpatient Visits           6 months ago Facial edema   [facility] Jacky Kindle, FNP   10 months ago Situational stress   [facility] Merita Norton T, FNP   1 year ago Acute pain of left shoulder   [facility] Jacky Kindle, FNP   1 year ago Adult hypothyroidism   [facility] Jacky Kindle, FNP   2 years ago Annual physical exam   [facility] Chrismon, Jodell Cipro, PA-C       Future Appointments             In 4 days Jacky Kindle, FNP [facility], [facility]

## 2023-02-17 NOTE — Telephone Encounter (Signed)
Medication Refill - Medication: levothyroxine (SYNTHROID) 88 MCG tablet   Has the patient contacted their pharmacy? No. (Agent: If no, request that the patient contact the pharmacy for the refill. If patient does not wish to contact the pharmacy document the reason why and proceed with request.) (Agent: If yes, when and what did the pharmacy advise?)  Preferred Pharmacy (with phone number or street name):  [id number] - [place], [place] - [address] [place] [address] AT [facility] [address] [facility] [address] Phone: [phone]  Fax: [phone]     Has the patient been seen for an appointment in the last year OR does the patient have an upcoming appointment? Yes.    Agent: Please be advised that RX refills may take up to 3 business days. We ask that you follow-up with your pharmacy.

## 2023-02-19 ENCOUNTER — Other Ambulatory Visit: Payer: Self-pay | Admitting: Family Medicine

## 2023-02-19 DIAGNOSIS — F439 Reaction to severe stress, unspecified: Secondary | ICD-10-CM

## 2023-02-20 NOTE — Telephone Encounter (Signed)
Requested medication (s) are due for refill today - yes  Requested medication (s) are on the active medication list -yes  Future visit scheduled -yes  Last refill: 03/25/22 180 1RF  Notes to clinic: non delegated Rx  Requested Prescriptions  Pending Prescriptions Disp Refills   QUEtiapine (SEROQUEL) 50 MG tablet [Pharmacy Med Name: QUETIAPINE 50MG   TABLETS] 180 tablet 1    Sig: TAKE 1 TO 2 TABLETS BY MOUTH BEFORE*BED TO ASSIST WITH MOOD DISTURBANCE FROM ACUTE STRESSORS     Not Delegated - Psychiatry:  Antipsychotics - Second Generation (Atypical) - quetiapine Failed - 02/19/2023  3:33 AM      Failed - This refill cannot be delegated      Failed - Last BP in normal range    BP Readings from Last 1 Encounters:  08/01/22 (!) 156/90         Failed - Valid encounter within last 6 months    Recent Outpatient Visits           6 months ago Facial edema   [facility] Jacky Kindle, FNP   11 months ago Situational stress   [facility] Merita Norton T, FNP   1 year ago Acute pain of left shoulder   [facility] Jacky Kindle, FNP   1 year ago Adult hypothyroidism   [facility] Merita Norton T, FNP   2 years ago Annual physical exam   [facility] Chrismon, Jodell Cipro, PA-C       Future Appointments             Tomorrow Jacky Kindle, FNP  [facility], [facility]            Failed - Lipid Panel in normal range within the last 12 months    Cholesterol, Total  Date Value Ref Range Status  08/01/2022 290 (H) 100 - 199 mg/dL Final   LDL Chol Calc (NIH)  Date Value Ref Range Status  08/01/2022 110 (H) 0 - 99 mg/dL Final   LDL Direct  Date Value Ref Range Status  01/30/2019 176 (H) 0 - 99 mg/dL Final   HDL  Date Value Ref Range Status  08/01/2022 38 (L) >39 mg/dL Final   Triglycerides  Date Value Ref Range Status   08/01/2022 798 (HH) 0 - 149 mg/dL Final         Passed - TSH in normal range and within 360 days    TSH  Date Value Ref Range Status  08/01/2022 1.320 0.450 - 4.500 uIU/mL Final         Passed - Completed PHQ-2 or PHQ-9 in the last 360 days      Passed - Last Heart Rate in normal range    Pulse Readings from Last 1 Encounters:  08/01/22 79         Passed - CBC within normal limits and completed in the last 12 months    WBC  Date Value Ref Range Status  08/01/2022 10.1 3.4 - 10.8 x10E3/uL Final  10/13/2020 8.1 4.0 - 10.5 K/uL Final   RBC  Date Value Ref Range Status  08/01/2022 4.67 3.77 - 5.28 x10E6/uL Final  10/13/2020 4.66 3.87 - 5.11 MIL/uL Final   Hemoglobin  Date Value Ref Range Status  08/01/2022 15.5 11.1 - 15.9 g/dL Final   Hematocrit  Date Value Ref Range Status  08/01/2022 43.9 34.0 - 46.6 %  Final   MCHC  Date Value Ref Range Status  08/01/2022 35.3 31.5 - 35.7 g/dL Final  40/98/1191 47.8 30.0 - 36.0 g/dL Final   [facility]  Date Value Ref Range Status  08/01/2022 33.2 (H) 26.6 - 33.0 pg Final  10/13/2020 32.2 26.0 - 34.0 pg Final   MCV  Date Value Ref Range Status  08/01/2022 94 79 - 97 fL Final   No results found for: "PLTCOUNTKUC", "LABPLAT", "POCPLA" RDW  Date Value Ref Range Status  08/01/2022 12.5 11.7 - 15.4 % Final         Passed - CMP within normal limits and completed in the last 12 months    Albumin  Date Value Ref Range Status  08/01/2022 4.5 3.8 - 4.9 g/dL Final   Alkaline Phosphatase  Date Value Ref Range Status  08/01/2022 113 44 - 121 IU/L Final   ALT  Date Value Ref Range Status  08/01/2022 39 (H) 0 - 32 IU/L Final   AST  Date Value Ref Range Status  08/01/2022 35 0 - 40 IU/L Final   BUN  Date Value Ref Range Status  08/01/2022 19 6 - 24 mg/dL Final   Calcium  Date Value Ref Range Status  08/01/2022 9.5 8.7 - 10.2 mg/dL Final   CO2  Date Value Ref Range Status  08/01/2022 21 20 - 29 mmol/L Final   Creatinine,  Ser  Date Value Ref Range Status  08/01/2022 1.01 (H) 0.57 - 1.00 mg/dL Final   Glucose  Date Value Ref Range Status  08/01/2022 145 (H) 70 - 99 mg/dL Final   Glucose, Bld  Date Value Ref Range Status  10/13/2020 115 (H) 70 - 99 mg/dL Final    Comment:    Glucose reference range applies only to samples taken after fasting for at least 8 hours.   Potassium  Date Value Ref Range Status  08/01/2022 4.3 3.5 - 5.2 mmol/L Final   Sodium  Date Value Ref Range Status  08/01/2022 139 134 - 144 mmol/L Final   Bilirubin Total  Date Value Ref Range Status  08/01/2022 0.3 0.0 - 1.2 mg/dL Final   Total Protein  Date Value Ref Range Status  08/01/2022 6.8 6.0 - 8.5 g/dL Final   GFR calc Af Amer  Date Value Ref Range Status  03/30/2020 97 >59 mL/min/1.73 Final    Comment:    **In accordance with recommendations from the NKF-ASN Task force,**   [facility] is in the process of updating its eGFR calculation to the   2021 CKD-EPI creatinine equation that estimates kidney function   without a race variable.    eGFR  Date Value Ref Range Status  08/01/2022 65 >59 mL/min/1.73 Final   GFR, Estimated  Date Value Ref Range Status  10/13/2020 >60 >60 mL/min Final    Comment:    (NOTE) Calculated using the CKD-EPI Creatinine Equation (2021)             Requested Prescriptions  Pending Prescriptions Disp Refills   QUEtiapine (SEROQUEL) 50 MG tablet [Pharmacy Med Name: QUETIAPINE 50MG   TABLETS] 180 tablet 1    Sig: TAKE 1 TO 2 TABLETS BY MOUTH BEFORE*BED TO ASSIST WITH MOOD DISTURBANCE FROM ACUTE STRESSORS     Not Delegated - Psychiatry:  Antipsychotics - Second Generation (Atypical) - quetiapine Failed - 02/19/2023  3:33 AM      Failed - This refill cannot be delegated      Failed - Last BP in normal range  BP Readings from Last 1 Encounters:  08/01/22 (!) 156/90         Failed - Valid encounter within last 6 months    Recent Outpatient Visits           6 months ago  Facial edema   [facility] [facility] Jacky Kindle, FNP   11 months ago Situational stress   [facility] Merita Norton T, FNP   1 year ago Acute pain of left shoulder   [facility] Jacky Kindle, FNP   1 year ago Adult hypothyroidism   [facility] Merita Norton T, FNP   2 years ago Annual physical exam   [facility] Chrismon, Jodell Cipro, PA-C       Future Appointments             Tomorrow Jacky Kindle, FNP [facility]            Failed - Lipid Panel in normal range within the last 12 months    Cholesterol, Total  Date Value Ref Range Status  08/01/2022 290 (H) 100 - 199 mg/dL Final   LDL Chol Calc (NIH)  Date Value Ref Range Status  08/01/2022 110 (H) 0 - 99 mg/dL Final   LDL Direct  Date Value Ref Range Status  01/30/2019 176 (H) 0 - 99 mg/dL Final   HDL  Date Value Ref Range Status  08/01/2022 38 (L) >39 mg/dL Final   Triglycerides  Date Value Ref Range Status  08/01/2022 798 (HH) 0 - 149 mg/dL Final         Passed - TSH in normal range and within 360 days    TSH  Date Value Ref Range Status  08/01/2022 1.320 0.450 - 4.500 uIU/mL Final         Passed - Completed PHQ-2 or PHQ-9 in the last 360 days      Passed - Last Heart Rate in normal range    Pulse Readings from Last 1 Encounters:  08/01/22 79         Passed - CBC within normal limits and completed in the last 12 months    WBC  Date Value Ref Range Status  08/01/2022 10.1 3.4 - 10.8 x10E3/uL Final  10/13/2020 8.1 4.0 - 10.5 K/uL Final   RBC  Date Value Ref Range Status  08/01/2022 4.67 3.77 - 5.28 x10E6/uL Final  10/13/2020 4.66 3.87 - 5.11 MIL/uL Final   Hemoglobin  Date Value Ref Range Status  08/01/2022 15.5 11.1 - 15.9 g/dL Final   Hematocrit  Date Value Ref Range Status  08/01/2022 43.9 34.0 - 46.6 % Final   MCHC  Date Value  Ref Range Status  08/01/2022 35.3 31.5 - 35.7 g/dL Final  57/84/6962 95.2 30.0 - 36.0 g/dL Final   [facility]  Date Value Ref Range Status  08/01/2022 33.2 (H) 26.6 - 33.0 pg Final  10/13/2020 32.2 26.0 - 34.0 pg Final   MCV  Date Value Ref Range Status  08/01/2022 94 79 - 97 fL Final   No results found for: "PLTCOUNTKUC", "LABPLAT", "POCPLA" RDW  Date Value Ref Range Status  08/01/2022 12.5 11.7 - 15.4 % Final         Passed - CMP within normal limits and completed in the last 12 months    Albumin  Date Value Ref Range Status  08/01/2022 4.5 3.8 - 4.9 g/dL  Final   Alkaline Phosphatase  Date Value Ref Range Status  08/01/2022 113 44 - 121 IU/L Final   ALT  Date Value Ref Range Status  08/01/2022 39 (H) 0 - 32 IU/L Final   AST  Date Value Ref Range Status  08/01/2022 35 0 - 40 IU/L Final   BUN  Date Value Ref Range Status  08/01/2022 19 6 - 24 mg/dL Final   Calcium  Date Value Ref Range Status  08/01/2022 9.5 8.7 - 10.2 mg/dL Final   CO2  Date Value Ref Range Status  08/01/2022 21 20 - 29 mmol/L Final   Creatinine, Ser  Date Value Ref Range Status  08/01/2022 1.01 (H) 0.57 - 1.00 mg/dL Final   Glucose  Date Value Ref Range Status  08/01/2022 145 (H) 70 - 99 mg/dL Final   Glucose, Bld  Date Value Ref Range Status  10/13/2020 115 (H) 70 - 99 mg/dL Final    Comment:    Glucose reference range applies only to samples taken after fasting for at least 8 hours.   Potassium  Date Value Ref Range Status  08/01/2022 4.3 3.5 - 5.2 mmol/L Final   Sodium  Date Value Ref Range Status  08/01/2022 139 134 - 144 mmol/L Final   Bilirubin Total  Date Value Ref Range Status  08/01/2022 0.3 0.0 - 1.2 mg/dL Final   Total Protein  Date Value Ref Range Status  08/01/2022 6.8 6.0 - 8.5 g/dL Final   GFR calc Af Amer  Date Value Ref Range Status  03/30/2020 97 >59 mL/min/1.73 Final    Comment:    **In accordance with recommendations from the NKF-ASN Task force,**    [facility] is in the process of updating its eGFR calculation to the   2021 CKD-EPI creatinine equation that estimates kidney function   without a race variable.    eGFR  Date Value Ref Range Status  08/01/2022 65 >59 mL/min/1.73 Final   GFR, Estimated  Date Value Ref Range Status  10/13/2020 >60 >60 mL/min Final    Comment:    (NOTE) Calculated using the CKD-EPI Creatinine Equation (2021)

## 2023-02-21 ENCOUNTER — Ambulatory Visit (INDEPENDENT_AMBULATORY_CARE_PROVIDER_SITE_OTHER): Payer: 59 | Admitting: Family Medicine

## 2023-02-21 ENCOUNTER — Encounter: Payer: Self-pay | Admitting: Family Medicine

## 2023-02-21 VITALS — BP 147/88 | HR 68 | Ht 62.0 in | Wt 153.3 lb

## 2023-02-21 DIAGNOSIS — E039 Hypothyroidism, unspecified: Secondary | ICD-10-CM | POA: Diagnosis not present

## 2023-02-21 DIAGNOSIS — I10 Essential (primary) hypertension: Secondary | ICD-10-CM

## 2023-02-21 DIAGNOSIS — R7303 Prediabetes: Secondary | ICD-10-CM

## 2023-02-21 DIAGNOSIS — E782 Mixed hyperlipidemia: Secondary | ICD-10-CM

## 2023-02-21 DIAGNOSIS — Z23 Encounter for immunization: Secondary | ICD-10-CM | POA: Diagnosis not present

## 2023-02-21 DIAGNOSIS — F39 Unspecified mood [affective] disorder: Secondary | ICD-10-CM

## 2023-02-21 MED ORDER — QUETIAPINE FUMARATE 100 MG PO TABS
100.0000 mg | ORAL_TABLET | Freq: Every day | ORAL | 3 refills | Status: DC
Start: 2023-02-21 — End: 2023-07-13

## 2023-02-21 NOTE — Progress Notes (Signed)
Established patient visit   Patient: Mary Miller   DOB: [birth year]   57 y.o. Female  MRN: [id number] Visit Date: 02/21/2023  Today's healthcare provider: Jacky Kindle, FNP  Introduced to nurse practitioner role and practice setting.  All questions answered.  Discussed provider/patient relationship and expectations.  Subjective    HPI  Discussed the use of AI scribe software for clinical note transcription with the patient, who gave verbal consent to proceed.  History of Present Illness   The patient, with a history of hypertension, presents with persistently high blood pressure readings. They report that their blood pressure is "always a little high" at the clinic, but they do not monitor it at home. The patient's weight has remained relatively stable, with a minor fluctuation of 2.5 pounds. They are currently on Seroquel and Trazodone for mood regulation, which they report is going well. However, they have run out of Trazodone.  The patient also reports body aches and pains, particularly behind the knee, in the buttocks, and in the arms. They describe the pain as a puffiness or swelling, but deny the presence of any cysts. They also report that their hands feel fragile and prone to injury.  In addition to these symptoms, the patient has been dealing with elevated glucose levels. They have not noticed any changes in this regard, but they wonder if this could be contributing to their inflammation and puffiness complaints.  The patient also reports that they are sleeping better since starting Seroquel, although they still have occasional trouble. They have not been smoking as much and have recently switched their medication refills [facility] due to insurance changes. They deny any recent falls.     Medications: Outpatient Medications Prior to Visit  Medication Sig   fenofibrate (TRICOR) 145 MG tablet Take 1 tablet (145 mg total) by mouth daily.   levothyroxine (SYNTHROID) 88 MCG tablet  TAKE 1 TABLET(88 MCG) BY MOUTH DAILY   meloxicam (MOBIC) 15 MG tablet TAKE 1 TABLET(15 MG) BY MOUTH DAILY   methocarbamol (ROBAXIN-750) 750 MG tablet Take 1 tablet (750 mg total) by mouth 4 (four) times daily.   QUEtiapine (SEROQUEL) 100 MG tablet Take 1 tablet (100 mg total) by mouth at bedtime.   rosuvastatin (CRESTOR) 40 MG tablet Take 1 tablet (40 mg total) by mouth daily.   traZODone (DESYREL) 100 MG tablet TAKE 1 TABLET BY MOUTH AT BEDTIME AS NEEDED FOR SLEEP   No facility-administered medications prior to visit.    Review of Systems    Objective    There were no vitals taken for this visit.  Physical Exam Vitals and nursing note reviewed.  Constitutional:      General: She is not in acute distress.    Appearance: Normal appearance. She is overweight. She is not ill-appearing, toxic-appearing or diaphoretic.  HENT:     Head: Normocephalic and atraumatic.  Cardiovascular:     Rate and Rhythm: Normal rate and regular rhythm.     Pulses: Normal pulses.     Heart sounds: Normal heart sounds. No murmur heard.    No friction rub. No gallop.  Pulmonary:     Effort: Pulmonary effort is normal. No respiratory distress.     Breath sounds: Normal breath sounds. No stridor. No wheezing, rhonchi or rales.  Chest:     Chest wall: No tenderness.  Musculoskeletal:        General: No swelling, tenderness, deformity or signs of injury. Normal range of motion.  Right lower leg: No edema.     Left lower leg: No edema.     Comments: Intermittent variable tenderness- R knee, R hip/buttocks, wrists/hands   Skin:    General: Skin is warm and dry.     Capillary Refill: Capillary refill takes less than 2 seconds.     Coloration: Skin is not jaundiced or pale.     Findings: No bruising, erythema, lesion or rash.  Neurological:     General: No focal deficit present.     Mental Status: She is alert and oriented to person, place, and time. Mental status is at baseline.     Cranial Nerves: No  cranial nerve deficit.     Sensory: No sensory deficit.     Motor: No weakness.     Coordination: Coordination normal.  Psychiatric:        Mood and Affect: Mood normal.        Behavior: Behavior normal.        Thought Content: Thought content normal.        Judgment: Judgment normal.     No results found for any visits on 02/21/23.  Assessment & Plan     Problem List Items Addressed This Visit   None Assessment and Plan    Hypertension Elevated blood pressure readings in the office, but unclear if this is representative of overall control. -Consider home blood pressure monitoring for a more accurate assessment.  Generalized body aches and pains Patient reports diffuse aches and pains, including in the hands and behind the knees. No known cause identified at this time. -Order labs to assess for potential metabolic causes, including glucose and cholesterol levels. -Consider low-dose diuretic for potential fluid control and blood pressure management, pending lab results.  Insomnia Patient reports improved sleep with Seroquel and Trazodone, but still has occasional difficulties. -[facility] -Continue current regimen of Seroquel and Trazodone.  Tobacco use Patient reports decreased use. -Encourage continued efforts to quit.  General Health Maintenance -Administer influenza vaccine today. -Check labs today for glucose and cholesterol levels. -Follow-up in 1 month if new medication is started based on lab results.     No follow-ups on file.     Leilani Merl, FNP, have reviewed all documentation for this visit. The documentation on 02/21/23 for the exam, diagnosis, procedures, and orders are all accurate and complete.  Jacky Kindle, FNP  [facility] [phone] (phone) [phone] (fax)  [facility]

## 2023-02-21 NOTE — Patient Instructions (Signed)
Please call and schedule your mammogram:  [facility]  [address], [address] [facility] [place],  [place]  [address] Get Driving Directions Main: [phone]  Sunday:Closed Monday:7:20 AM - 5:00 PM Tuesday:7:20 AM - 5:00 PM Wednesday:7:20 AM - 5:00 PM Thursday:7:20 AM - 5:00 PM Friday:7:20 AM - 4:30 PM Saturday:Closed  

## 2023-02-22 ENCOUNTER — Other Ambulatory Visit: Payer: Self-pay | Admitting: Family Medicine

## 2023-02-22 ENCOUNTER — Encounter: Payer: Self-pay | Admitting: Family Medicine

## 2023-02-22 DIAGNOSIS — E782 Mixed hyperlipidemia: Secondary | ICD-10-CM

## 2023-02-22 DIAGNOSIS — E7800 Pure hypercholesterolemia, unspecified: Secondary | ICD-10-CM

## 2023-02-22 DIAGNOSIS — G4700 Insomnia, unspecified: Secondary | ICD-10-CM

## 2023-02-22 DIAGNOSIS — F419 Anxiety disorder, unspecified: Secondary | ICD-10-CM

## 2023-02-22 DIAGNOSIS — E039 Hypothyroidism, unspecified: Secondary | ICD-10-CM

## 2023-02-22 DIAGNOSIS — F32A Depression, unspecified: Secondary | ICD-10-CM

## 2023-02-22 LAB — COMPREHENSIVE METABOLIC PANEL
ALT: 20 [IU]/L (ref 0–32)
AST: 21 [IU]/L (ref 0–40)
Albumin: 4.6 g/dL (ref 3.8–4.9)
Alkaline Phosphatase: 92 [IU]/L (ref 44–121)
BUN/Creatinine Ratio: 15 (ref 9–23)
BUN: 14 mg/dL (ref 6–24)
Bilirubin Total: 0.4 mg/dL (ref 0.0–1.2)
CO2: 24 mmol/L (ref 20–29)
Calcium: 9.4 mg/dL (ref 8.7–10.2)
Chloride: 103 mmol/L (ref 96–106)
Creatinine, Ser: 0.93 mg/dL (ref 0.57–1.00)
Globulin, Total: 2.2 g/dL (ref 1.5–4.5)
Glucose: 103 mg/dL — ABNORMAL HIGH (ref 70–99)
Potassium: 4.3 mmol/L (ref 3.5–5.2)
Sodium: 141 mmol/L (ref 134–144)
Total Protein: 6.8 g/dL (ref 6.0–8.5)
eGFR: 71 mL/min/{1.73_m2} (ref >59)

## 2023-02-22 LAB — CBC WITH DIFFERENTIAL/PLATELET
Basophils Absolute: 0.1 10*3/uL (ref 0.0–0.2)
Basos: 1 %
EOS (ABSOLUTE): 0.2 10*3/uL (ref 0.0–0.4)
Eos: 2 %
Hematocrit: 43.6 % (ref 34.0–46.6)
Hemoglobin: 14.4 g/dL (ref 11.1–15.9)
Immature Grans (Abs): 0 10*3/uL (ref 0.0–0.1)
Immature Granulocytes: 1 %
Lymphocytes Absolute: 2.2 10*3/uL (ref 0.7–3.1)
Lymphs: 29 %
MCH: 31.6 pg (ref 26.6–33.0)
MCHC: 33 g/dL (ref 31.5–35.7)
MCV: 96 fL (ref 79–97)
Monocytes Absolute: 0.5 10*3/uL (ref 0.1–0.9)
Monocytes: 6 %
Neutrophils Absolute: 4.6 10*3/uL (ref 1.4–7.0)
Neutrophils: 61 %
Platelets: 346 10*3/uL (ref 150–450)
RBC: 4.55 x10E6/uL (ref 3.77–5.28)
RDW: 12.7 % (ref 11.7–15.4)
WBC: 7.5 10*3/uL (ref 3.4–10.8)

## 2023-02-22 LAB — LIPID PANEL
Chol/HDL Ratio: 5.9 {ratio} — ABNORMAL HIGH (ref 0.0–4.4)
Cholesterol, Total: 255 mg/dL — ABNORMAL HIGH (ref 100–199)
HDL: 43 mg/dL (ref >39)
LDL Chol Calc (NIH): 159 mg/dL — ABNORMAL HIGH (ref 0–99)
Triglycerides: 285 mg/dL — ABNORMAL HIGH (ref 0–149)
VLDL Cholesterol Cal: 53 mg/dL — ABNORMAL HIGH (ref 5–40)

## 2023-02-22 LAB — HEMOGLOBIN A1C
Est. average glucose Bld gHb Est-mCnc: 131 mg/dL
Hgb A1c MFr Bld: 6.2 % — ABNORMAL HIGH (ref 4.8–5.6)

## 2023-02-22 LAB — TSH: TSH: 0.441 u[IU]/mL — ABNORMAL LOW (ref 0.450–4.500)

## 2023-02-22 MED ORDER — LISINOPRIL-HYDROCHLOROTHIAZIDE 20-12.5 MG PO TABS: 1.0000 | ORAL_TABLET | Freq: Every day | ORAL | 3 refills | Status: DC

## 2023-02-22 MED ORDER — EZETIMIBE 10 MG PO TABS: 10.0000 mg | ORAL_TABLET | Freq: Every day | ORAL | 3 refills | Status: DC

## 2023-02-22 MED ORDER — ROSUVASTATIN CALCIUM 40 MG PO TABS
40.0000 mg | ORAL_TABLET | Freq: Every day | ORAL | 3 refills | Status: DC
Start: 2023-02-22 — End: 2023-12-25

## 2023-02-22 MED ORDER — FENOFIBRATE 145 MG PO TABS
145.0000 mg | ORAL_TABLET | Freq: Every day | ORAL | 3 refills | Status: AC
Start: 2023-02-22 — End: ?

## 2023-02-22 MED ORDER — TRAZODONE HCL 100 MG PO TABS
ORAL_TABLET | ORAL | 3 refills | Status: DC
Start: 2023-02-22 — End: 2023-07-13

## 2023-02-22 NOTE — Progress Notes (Signed)
Cholesterol total, fats, HDL, VLDL are all improved as is your overall cholesterol ratio. However, your LDL is elevated. The 10-year ASCVD risk score (Arnett DK, et al., 2019) is: 11.4% Given an elevated risk of heart attack and/or stroke I continue to recommend diet low in saturated fat and regular exercise - 30 min at least 5 times per week  Ensure you are taking BOTH tricor 145 and Crestor 40 mg. If you have been, I would recommend trial of Zetia 10 to assist LDL and risk reduction. If we are unable to obtain LDL goals at that time I would recommend trial of Repatha injection.   I continue to recommend tobacco cessation to assist risk reduction as well.

## 2023-03-01 ENCOUNTER — Telehealth: Payer: Self-pay

## 2023-03-01 NOTE — Telephone Encounter (Signed)
LVM for patient to call if any concerns regarding lab results sent via MyChart

## 2023-03-02 MED ORDER — LISINOPRIL-HYDROCHLOROTHIAZIDE 20-12.5 MG PO TABS: 1.0000 | ORAL_TABLET | Freq: Every day | ORAL | 3 refills | Status: DC

## 2023-03-24 ENCOUNTER — Ambulatory Visit (INDEPENDENT_AMBULATORY_CARE_PROVIDER_SITE_OTHER): Payer: 59 | Admitting: Family Medicine

## 2023-03-24 VITALS — BP 148/89 | HR 76 | Temp 98.1°F | Resp 16 | Ht 62.0 in | Wt 151.0 lb

## 2023-03-24 DIAGNOSIS — F39 Unspecified mood [affective] disorder: Secondary | ICD-10-CM | POA: Insufficient documentation

## 2023-03-24 DIAGNOSIS — I10 Essential (primary) hypertension: Secondary | ICD-10-CM

## 2023-03-24 DIAGNOSIS — M7542 Impingement syndrome of left shoulder: Secondary | ICD-10-CM | POA: Insufficient documentation

## 2023-03-24 DIAGNOSIS — F17200 Nicotine dependence, unspecified, uncomplicated: Secondary | ICD-10-CM | POA: Insufficient documentation

## 2023-03-24 MED ORDER — GABAPENTIN 300 MG PO CAPS
300.0000 mg | ORAL_CAPSULE | Freq: Three times a day (TID) | ORAL | 3 refills | Status: DC
Start: 2023-03-24 — End: 2023-05-30

## 2023-03-24 MED ORDER — PREDNISONE 10 MG PO TABS
ORAL_TABLET | ORAL | 0 refills | Status: DC
Start: 2023-03-24 — End: 2023-05-26

## 2023-03-24 MED ORDER — LISINOPRIL-HYDROCHLOROTHIAZIDE 10-12.5 MG PO TABS
1.0000 | ORAL_TABLET | Freq: Every day | ORAL | 0 refills | Status: DC
Start: 2023-03-24 — End: 2023-05-10

## 2023-03-24 MED ORDER — BACLOFEN 20 MG PO TABS
20.0000 mg | ORAL_TABLET | Freq: Three times a day (TID) | ORAL | 0 refills | Status: DC | PRN
Start: 2023-03-24 — End: 2023-04-17

## 2023-03-24 NOTE — Progress Notes (Signed)
Established patient visit   Patient: Mary Miller   DOB: [birth year]   57 y.o. Female  MRN: [id number] Visit Date: 03/24/2023  Today's healthcare provider: Jacky Kindle, FNP  Re Introduced to nurse practitioner role and practice setting.  All questions answered.  Discussed provider/patient relationship and expectations.  Subjective    HPI HPI     Hypertension    Additional comments: Patient was last seen on 02/21/23.  At the time of the visit there was not any mention of her starting medication.  There is also no note on her labs from that day about BP medication.  On 03/02/23 Lisinopril HCT was sent to her pharmacy.  She states she was not told anything about it being sent so she did not pick it up and start it.  She wants to discuss it first. Patient has been checking her blood pressure at home and has been getting readings of 120s over 70s      Last edited by Adline Peals, CMA on 03/24/2023  8:25 AM.       The patient, with a history of high blood pressure and left arm pain, presents for a follow-up visit. The patient has not started the prescribed blood pressure medication due to concerns and needs further explanation. 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 The pain is constant and worsens at night, causing discomfort during sleep. The patient also reports that the arm occasionally swells. The patient is currently on Seroquel for mood management and reports good control of mood symptoms. The patient has been under stress due to personal family issues, which may be contributing to the high blood pressure and mood symptoms.  Medications: Outpatient Medications Prior to Visit  Medication Sig   ezetimibe (ZETIA) 10 MG tablet Take 1 tablet (10 mg total) by mouth daily.   fenofibrate (TRICOR) 145 MG tablet Take 1 tablet (145 mg total) by mouth daily.   levothyroxine (SYNTHROID) 88 MCG tablet  TAKE 1 TABLET(88 MCG) BY MOUTH DAILY   QUEtiapine (SEROQUEL) 100 MG tablet Take 1 tablet (100 mg total) by mouth at bedtime.   rosuvastatin (CRESTOR) 40 MG tablet Take 1 tablet (40 mg total) by mouth daily.   traZODone (DESYREL) 100 MG tablet TAKE 1 TABLET BY MOUTH AT BEDTIME AS NEEDED FOR SLEEP   [DISCONTINUED] lisinopril-hydrochlorothiazide (ZESTORETIC) 20-12.5 MG tablet Take 1 tablet by mouth daily. (Patient not taking: Reported on 03/24/2023)   No facility-administered medications prior to visit.     Objective    BP (!) 148/89 (BP Location: Right Arm, Patient Position: Sitting, Cuff Size: Normal)   Pulse 76   Temp 98.1 F (36.7 C) (Oral)   Resp 16   Ht 5\' 2"  (1.575 m)   Wt 151 lb (68.5 kg)   BMI 27.62 kg/m   Physical Exam Vitals and nursing note reviewed.  Constitutional:      General: She is not in acute distress.    Appearance: Normal appearance. She is overweight. She is not ill-appearing, toxic-appearing or diaphoretic.  HENT:     Head: Normocephalic and atraumatic.  Cardiovascular:     Rate and Rhythm: Normal rate and regular rhythm.     Pulses: Normal pulses.     Heart sounds: Normal heart sounds. No murmur heard.    No friction rub. No gallop.  Pulmonary:     Effort: Pulmonary effort is normal. No respiratory distress.  Breath sounds: Normal breath sounds. No stridor. No wheezing, rhonchi or rales.  Chest:     Chest wall: No tenderness.  Musculoskeletal:        General: No swelling, tenderness, deformity or signs of injury. Normal range of motion.     Right lower leg: No edema.     Left lower leg: No edema.  Skin:    General: Skin is warm and dry.     Capillary Refill: Capillary refill takes less than 2 seconds.     Coloration: Skin is not jaundiced or pale.     Findings: No bruising, erythema, lesion or rash.  Neurological:     General: No focal deficit present.     Mental Status: She is alert and oriented to person, place, and time. Mental status is at  baseline.     Cranial Nerves: No cranial nerve deficit.     Sensory: No sensory deficit.     Motor: No weakness.     Coordination: Coordination normal.  Psychiatric:        Mood and Affect: Mood normal.        Behavior: Behavior normal.        Thought Content: Thought content normal.        Judgment: Judgment normal.     No results found for any visits on 03/24/23.  Assessment & Plan     Hypertension Persistent elevated blood pressure. Discussed the importance of medication adherence and lifestyle modifications including dietary changes and exercise. -Start blood pressure medication as prescribed. -Check blood pressure at home regularly. -Follow-up in 1 month to assess blood pressure control.  Shoulder Pain Chronic shoulder pain, likely due to nerve impingement and potential cervical radiculopathy. Pain exacerbated by stress and tension. -Trial prior to seeing [facility] last year with conservative pain management medications (Meloxicam and Robaxin). -Add low-dose Gabapentin for nerve pain. -recommend pred taper to assist with inflammation; will avoid NSAIDs at this time given hypertension  -Consider adding Baclofen or Flexeril for muscle relaxation. -Implement home exercises for shoulder pain relief (tennis ball massage, towel roll stretch, wall arm stretch, and chin tuck exercise). -Consider referral to sports medicine or return to [facility] if pain persists.  Mood Stable on Seroquel 100 mg at bedtime -Continue current regimen.  Follow-up in 1 month to assess blood pressure control and shoulder pain management. The 10-year ASCVD risk score (Arnett DK, et al., 2019) is: 15.5%  Return in about 4 weeks (around 04/21/2023) for blood pressure check.     Leilani Merl, FNP, have reviewed all documentation for this visit. The documentation on 03/24/23 for the exam, diagnosis, procedures, and orders are all accurate and complete.  Jacky Kindle, FNP  [facility] [phone] (phone) [phone] (fax)  [facility]

## 2023-04-15 ENCOUNTER — Other Ambulatory Visit: Payer: Self-pay | Admitting: Family Medicine

## 2023-04-15 DIAGNOSIS — M7542 Impingement syndrome of left shoulder: Secondary | ICD-10-CM

## 2023-04-17 NOTE — Telephone Encounter (Signed)
Please advise 

## 2023-04-25 ENCOUNTER — Ambulatory Visit (INDEPENDENT_AMBULATORY_CARE_PROVIDER_SITE_OTHER): Payer: 59 | Admitting: Family Medicine

## 2023-04-25 DIAGNOSIS — Z91199 Patient's noncompliance with other medical treatment and regimen due to unspecified reason: Secondary | ICD-10-CM

## 2023-04-25 NOTE — Progress Notes (Signed)
Patient was not seen for appt d/t no call, no show, or late arrival >10 mins past appt time.   Elise T Payne, FNP  [facility] [address] [place], [place] [address] [phone] (phone) [phone] (fax) [facility]  

## 2023-05-10 ENCOUNTER — Ambulatory Visit (INDEPENDENT_AMBULATORY_CARE_PROVIDER_SITE_OTHER): Payer: 59 | Admitting: Family Medicine

## 2023-05-10 VITALS — BP 114/68 | HR 102 | Ht 62.0 in | Wt 151.5 lb

## 2023-05-10 DIAGNOSIS — R399 Unspecified symptoms and signs involving the genitourinary system: Secondary | ICD-10-CM | POA: Diagnosis not present

## 2023-05-10 DIAGNOSIS — Z1239 Encounter for other screening for malignant neoplasm of breast: Secondary | ICD-10-CM

## 2023-05-10 DIAGNOSIS — I10 Essential (primary) hypertension: Secondary | ICD-10-CM

## 2023-05-10 MED ORDER — LISINOPRIL-HYDROCHLOROTHIAZIDE 10-12.5 MG PO TABS: 1.0000 | ORAL_TABLET | Freq: Every day | ORAL | 3 refills | Status: AC

## 2023-05-10 MED ORDER — METRONIDAZOLE 500 MG PO TABS: 500.0000 mg | ORAL_TABLET | Freq: Three times a day (TID) | ORAL | 0 refills | Status: AC

## 2023-05-10 MED ORDER — CIPROFLOXACIN HCL 500 MG PO TABS: 500.0000 mg | ORAL_TABLET | Freq: Two times a day (BID) | ORAL | 0 refills | Status: AC

## 2023-05-10 MED ORDER — FLUCONAZOLE 150 MG PO TABS
ORAL_TABLET | ORAL | 0 refills | Status: DC
Start: 2023-05-10 — End: 2023-06-29

## 2023-05-10 NOTE — Progress Notes (Signed)
Established patient visit   Patient: Mary Miller   DOB: January 14, 1966   57 y.o. Female  MRN: [id number] Visit Date: 05/10/2023  Today's healthcare provider: Jacky Kindle, FNP  Introduced to nurse practitioner role and practice setting.  All questions answered.  Discussed provider/patient relationship and expectations.  Chief Complaint  Patient presents with   Medical Management of Chronic Issues    Last seen 03/24/23   Hypertension    120's/70s's at home. Reports lower back pain today. Started 3 days ago starting on her lower left side and has radiate to her back. May cause high reading. Reports taking ibuprofen for it   Urinary Tract Infection   Subjective    HPI HPI     Medical Management of Chronic Issues    Additional comments: Last seen 03/24/23        Hypertension   This is a chronic problem.  The problem is uncontrolled.  Compliance with treatment is excellent.  Anxiety: Absent.  Blurred vision: Absent.  Chest pain: Absent.  Chest pressure/discomfort: Absent.  Dyspnea: Absent.  Headaches: Absent.  Lower extremity edema: Absent.  Orthopnea: Absent.  Palpitations: Absent.  Paroxysmal nocturnal dyspnea: Absent.  Syncope: Absent.  The patient exercises daily. Additional comments: 120's/70s's at home. Reports lower back pain today. Started 3 days ago starting on her lower left side and has radiate to her back. May cause high reading. Reports taking ibuprofen for it        Urinary Tract Infection   This is a new problem.  Recent episode started in the past 7 days (Sunday night).  Severity of the pain is none.  The patient  has not been recently treated for similar symptoms.  Abdominal Pain: Present.  Back Pain: Present.  Chills: Absent.  Cloudy malodorus urine: Absent.  Constipation: Present.  Cramping: Present.  Diarrhea: Absent.  Discharge: Absent.  Fever: Present.  Hematuria: Absent.  Nausea: Present.  Vomiting: Present.      Last edited by Acey Lav, CMA  on 05/10/2023  2:55 PM.      Medications: Outpatient Medications Prior to Visit  Medication Sig   baclofen (LIORESAL) 20 MG tablet TAKE 1 TABLET (20 MG TOTAL) BY MOUTH 3 (THREE) TIMES DAILY AS NEEDED FOR MUSCLE SPASMS.   ezetimibe (ZETIA) 10 MG tablet Take 1 tablet (10 mg total) by mouth daily.   fenofibrate (TRICOR) 145 MG tablet Take 1 tablet (145 mg total) by mouth daily.   gabapentin (NEURONTIN) 300 MG capsule Take 1 capsule (300 mg total) by mouth 3 (three) times daily.   levothyroxine (SYNTHROID) 88 MCG tablet TAKE 1 TABLET(88 MCG) BY MOUTH DAILY   predniSONE (DELTASONE) 10 MG tablet Day 1 & 2 take 6 tablets Day 3 &4 take 5 tablets Day 5 &6 take 4 tablets Day 7 & 8 take 3 tablets Day 9 & 10 take 2 tablets Day 11 & 12 take 1 tablet Day 13 & 14 take 1/2 tablet   QUEtiapine (SEROQUEL) 100 MG tablet Take 1 tablet (100 mg total) by mouth at bedtime.   rosuvastatin (CRESTOR) 40 MG tablet Take 1 tablet (40 mg total) by mouth daily.   traZODone (DESYREL) 100 MG tablet TAKE 1 TABLET BY MOUTH AT BEDTIME AS NEEDED FOR SLEEP   [DISCONTINUED] lisinopril-hydrochlorothiazide (ZESTORETIC) 10-12.5 MG tablet Take 1 tablet by mouth daily.   No facility-administered medications prior to visit.   Last CBC Lab Results  Component Value Date   WBC 7.5 02/21/2023  HGB 14.4 02/21/2023   HCT 43.6 02/21/2023   MCV 96 02/21/2023   MCH 31.6 02/21/2023   RDW 12.7 02/21/2023   PLT 346 02/21/2023   Last metabolic panel Lab Results  Component Value Date   GLUCOSE 103 (H) 02/21/2023   NA 141 02/21/2023   K 4.3 02/21/2023   CL 103 02/21/2023   CO2 24 02/21/2023   BUN 14 02/21/2023   CREATININE 0.93 02/21/2023   EGFR 71 02/21/2023   CALCIUM 9.4 02/21/2023   PROT 6.8 02/21/2023   ALBUMIN 4.6 02/21/2023   LABGLOB 2.2 02/21/2023   AGRATIO 2.0 08/01/2022   BILITOT 0.4 02/21/2023   ALKPHOS 92 02/21/2023   AST 21 02/21/2023   ALT 20 02/21/2023   ANIONGAP 9 10/13/2020   Last lipids Lab Results   Component Value Date   CHOL 255 (H) 02/21/2023   HDL 43 02/21/2023   LDLCALC 159 (H) 02/21/2023   LDLDIRECT 176 (H) 01/30/2019   TRIG 285 (H) 02/21/2023   CHOLHDL 5.9 (H) 02/21/2023   Last hemoglobin A1c Lab Results  Component Value Date   HGBA1C 6.2 (H) 02/21/2023   Last thyroid functions Lab Results  Component Value Date   TSH 0.441 (L) 02/21/2023   Last vitamin D No results found for: "25OHVITD2", "25OHVITD3", "VD25OH" Last vitamin B12 and Folate No results found for: "VITAMINB12", "FOLATE"     Objective    BP 114/68 (BP Location: Right Arm, Patient Position: Sitting, Cuff Size: Small)   Pulse (!) 102   Ht 5\' 2"  (1.575 m)   Wt 151 lb 8 oz (68.7 kg)   SpO2 98%   BMI 27.71 kg/m  BP Readings from Last 3 Encounters:  05/10/23 114/68  03/24/23 (!) 148/89  02/21/23 (!) 147/88   Wt Readings from Last 3 Encounters:  05/10/23 151 lb 8 oz (68.7 kg)  03/24/23 151 lb (68.5 kg)  02/21/23 153 lb 4.8 oz (69.5 kg)   SpO2 Readings from Last 3 Encounters:  05/10/23 98%  02/21/23 99%  08/01/22 100%  Body mass index is 27.71 kg/m.  Physical Exam Vitals and nursing note reviewed.  Constitutional:      General: She is not in acute distress.    Appearance: Normal appearance. She is overweight. She is not ill-appearing, toxic-appearing or diaphoretic.  HENT:     Head: Normocephalic and atraumatic.  Cardiovascular:     Rate and Rhythm: Normal rate and regular rhythm.     Pulses: Normal pulses.     Heart sounds: Normal heart sounds. No murmur heard.    No friction rub. No gallop.  Pulmonary:     Effort: Pulmonary effort is normal. No respiratory distress.     Breath sounds: Normal breath sounds. No stridor. No wheezing, rhonchi or rales.  Chest:     Chest wall: No tenderness.  Abdominal:     General: Bowel sounds are normal.     Palpations: Abdomen is soft.     Tenderness: There is no right CVA tenderness, left CVA tenderness or guarding.  Musculoskeletal:         General: No swelling, tenderness, deformity or signs of injury. Normal range of motion.     Right lower leg: No edema.     Left lower leg: No edema.  Skin:    General: Skin is warm and dry.     Capillary Refill: Capillary refill takes less than 2 seconds.     Coloration: Skin is not jaundiced or pale.     Findings:  No bruising, erythema, lesion or rash.  Neurological:     General: No focal deficit present.     Mental Status: She is alert and oriented to person, place, and time. Mental status is at baseline.     Cranial Nerves: No cranial nerve deficit.     Sensory: No sensory deficit.     Motor: No weakness.     Coordination: Coordination normal.  Psychiatric:        Mood and Affect: Mood normal.        Behavior: Behavior normal.        Thought Content: Thought content normal.        Judgment: Judgment normal.    No results found for any visits on 05/10/23.  Assessment & Plan     Problem List Items Addressed This Visit       Cardiovascular and Mediastinum   Primary hypertension - Primary   Chronic, improved At goal Continue to focus on tobacco cessation to assist Continue medication as previously prescribed Zestoretic 10-12.5      Relevant Medications   lisinopril-hydrochlorothiazide (ZESTORETIC) 10-12.5 MG tablet     Other   Screening breast examination   Due for screening for mammogram, denies breast concerns, provided with phone number to call and schedule appointment for mammogram. Encouraged to repeat breast cancer screening every 1-2 years.       Relevant Orders   MM 3D SCREENING MAMMOGRAM BILATERAL BREAST   UTI symptoms   Acute, will start ABX given serverity of symptoms with pain Plan on UA with micro and Ucx       Relevant Orders   Urinalysis, Routine w reflex microscopic   Urine Culture      03/24/2023    8:22 AM 03/30/2020    3:04 PM 03/08/2018    8:16 AM 06/02/2016    9:28 AM  GAD 7 : Generalized Anxiety Score  Nervous, Anxious, on Edge 1 0 0  3  Control/stop worrying 1 1 0 3  Worry too much - different things 1 3 0 3  Trouble relaxing 0 1 2 3   Restless 0 0 0 1  Easily annoyed or irritable 0 0 0 3  Afraid - awful might happen 0 0 0 1  Total GAD 7 Score 3 5 2 17   Anxiety Difficulty Not difficult at all Not difficult at all Somewhat difficult Very difficult       03/24/2023    8:22 AM 03/25/2022   10:34 AM 12/28/2021    8:11 AM  PHQ9 SCORE ONLY  PHQ-9 Total Score 1 10 3    Return in about 2 weeks (around 05/24/2023) for chonic disease management.     Leilani Merl, FNP, have reviewed all documentation for this visit. The documentation on 05/20/23 for the exam, diagnosis, procedures, and orders are all accurate and complete.  Jacky Kindle, FNP  [facility] [phone] (phone) [phone] (fax)  [facility]

## 2023-05-10 NOTE — Patient Instructions (Signed)
Please contact [phone] to schedule your mammogram. You will be asked your location preference to have procedure performed. You have two options listed below.  1) [facility] located at [address] [place], [place] [address] 2) [facility] [place] located at [address] [place],  [address]  Upon results being received our office will contact you. As well as all results can be viewed through your MyChart. Please feel free to contact us if you have any further questions or concerns.   

## 2023-05-20 ENCOUNTER — Other Ambulatory Visit: Payer: Self-pay | Admitting: Family Medicine

## 2023-05-20 DIAGNOSIS — Z1239 Encounter for other screening for malignant neoplasm of breast: Secondary | ICD-10-CM | POA: Insufficient documentation

## 2023-05-20 DIAGNOSIS — E039 Hypothyroidism, unspecified: Secondary | ICD-10-CM

## 2023-05-20 DIAGNOSIS — R399 Unspecified symptoms and signs involving the genitourinary system: Secondary | ICD-10-CM | POA: Insufficient documentation

## 2023-05-20 NOTE — Assessment & Plan Note (Signed)
Acute, will start ABX given serverity of symptoms with pain Plan on UA with micro and Ucx

## 2023-05-20 NOTE — Assessment & Plan Note (Signed)
Due for screening for mammogram, denies breast concerns, provided with phone number to call and schedule appointment for mammogram. Encouraged to repeat breast cancer screening every 1-2 years.  

## 2023-05-20 NOTE — Assessment & Plan Note (Signed)
Chronic, improved At goal Continue to focus on tobacco cessation to assist Continue medication as previously prescribed Zestoretic 10-12.5

## 2023-05-22 NOTE — Telephone Encounter (Signed)
Medication Refill -  Most Recent Primary Care Visit:  Provider: Merita Norton T  Department: [facility]  Visit Type: OFFICE VISIT  Date: 05/10/2023  Medication: evothyroxine (SYNTHROID) 88 MCG tablet   Has the patient contacted their pharmacy? yes (Agent: If no, request that the patient contact the pharmacy for the refill. If patient does not wish to contact the pharmacy document the reason why and proceed with request.) (Agent: If yes, when and what did the pharmacy advise?)contact pcp  Is this the correct pharmacy for this prescription? yes   This is the patient's preferred pharmacy:  [phone] [place], [place] - [address] [place] [address] Phone: [phone] Fax: [phone]   Has the prescription been filled recently? no  Is the patient out of the medication? yes  Has the patient been seen for an appointment in the last year OR does the patient have an upcoming appointment? yes  Can we respond through MyChart? yes  Agent: Please be advised that Rx refills may take up to 3 business days. We ask that you follow-up with your pharmacy.

## 2023-05-23 MED ORDER — LEVOTHYROXINE SODIUM 88 MCG PO TABS: ORAL_TABLET | ORAL | 0 refills | Status: DC

## 2023-05-26 ENCOUNTER — Ambulatory Visit
Admission: RE | Admit: 2023-05-26 | Discharge: 2023-05-26 | Disposition: A | Discharge status: Home / Self Care | Payer: 59 | Attending: Family Medicine | Admitting: Family Medicine

## 2023-05-26 ENCOUNTER — Ambulatory Visit (INDEPENDENT_AMBULATORY_CARE_PROVIDER_SITE_OTHER): Payer: 59 | Admitting: Family Medicine

## 2023-05-26 ENCOUNTER — Ambulatory Visit
Admission: RE | Admit: 2023-05-26 | Discharge: 2023-05-26 | Disposition: A | Discharge status: Home / Self Care | Payer: 59 | Source: Ambulatory Visit | Attending: Family Medicine | Admitting: Family Medicine

## 2023-05-26 VITALS — BP 100/63 | HR 73 | Ht 62.0 in | Wt 146.0 lb

## 2023-05-26 DIAGNOSIS — I10 Essential (primary) hypertension: Secondary | ICD-10-CM | POA: Diagnosis not present

## 2023-05-26 DIAGNOSIS — E039 Hypothyroidism, unspecified: Secondary | ICD-10-CM

## 2023-05-26 DIAGNOSIS — M545 Low back pain, unspecified: Secondary | ICD-10-CM | POA: Diagnosis present

## 2023-05-26 DIAGNOSIS — F17200 Nicotine dependence, unspecified, uncomplicated: Secondary | ICD-10-CM

## 2023-05-26 DIAGNOSIS — R7303 Prediabetes: Secondary | ICD-10-CM

## 2023-05-26 DIAGNOSIS — E782 Mixed hyperlipidemia: Secondary | ICD-10-CM

## 2023-05-26 MED ORDER — DICLOFENAC SODIUM 75 MG PO TBEC: 75.0000 mg | DELAYED_RELEASE_TABLET | Freq: Two times a day (BID) | ORAL | 0 refills | Status: DC

## 2023-05-26 MED ORDER — PREDNISONE 50 MG PO TABS: 50.0000 mg | ORAL_TABLET | Freq: Every day | ORAL | 0 refills | Status: DC

## 2023-05-26 NOTE — Assessment & Plan Note (Signed)
 Chronic, previously borderline Synthroid 88 mcg; repeat TSH

## 2023-05-26 NOTE — Assessment & Plan Note (Signed)
 Recommend trial of NSAIDs and pred burst; has PRN baclofen at home from shoulder injury Xray ordered; recommend f/u with sports med in 2 weeks Exercises attached to AVS Denies loss of bowel or bladder CTM; will need f/u with new PCP

## 2023-05-26 NOTE — Assessment & Plan Note (Signed)
Chronic, repeat A1c Continue to recommend balanced, lower carb meals. Smaller meal size, adding snacks. Choosing water as drink of choice and increasing purposeful exercise.

## 2023-05-26 NOTE — Progress Notes (Signed)
 Established patient visit  Patient: Mary Miller   DOB: 1965/07/23   58 y.o. Female  MRN: [id number] Visit Date: 05/26/2023  Today's healthcare provider: Kelly ONEIDA Cedar, FNP  Introduced to nurse practitioner role and practice setting.  All questions answered.  Discussed provider/patient relationship and expectations.  Chief Complaint  Patient presents with   Follow-up    F/u-diverticulitis--feeling a better but still having lower back  pain, especially sitting down.    Subjective    HPI HPI     Follow-up    Additional comments: F/u-diverticulitis--feeling a better but still having lower back  pain, especially sitting down.       Last edited by Deitra Therisa CHRISTELLA, CMA on 05/26/2023  9:47 AM.      Medications: Outpatient Medications Prior to Visit  Medication Sig   baclofen  (LIORESAL ) 20 MG tablet TAKE 1 TABLET (20 MG TOTAL) BY MOUTH 3 (THREE) TIMES DAILY AS NEEDED FOR MUSCLE SPASMS.   ezetimibe  (ZETIA ) 10 MG tablet Take 1 tablet (10 mg total) by mouth daily.   fenofibrate  (TRICOR ) 145 MG tablet Take 1 tablet (145 mg total) by mouth daily.   gabapentin  (NEURONTIN ) 300 MG capsule Take 1 capsule (300 mg total) by mouth 3 (three) times daily.   levothyroxine  (SYNTHROID ) 88 MCG tablet TAKE 1 TABLET(88 MCG) BY MOUTH DAILY   lisinopril -hydrochlorothiazide  (ZESTORETIC ) 10-12.5 MG tablet Take 1 tablet by mouth daily.   QUEtiapine  (SEROQUEL ) 100 MG tablet Take 1 tablet (100 mg total) by mouth at bedtime.   rosuvastatin  (CRESTOR ) 40 MG tablet Take 1 tablet (40 mg total) by mouth daily.   traZODone  (DESYREL ) 100 MG tablet TAKE 1 TABLET BY MOUTH AT BEDTIME AS NEEDED FOR SLEEP   [DISCONTINUED] predniSONE  (DELTASONE ) 10 MG tablet Day 1 & 2 take 6 tablets Day 3 &4 take 5 tablets Day 5 &6 take 4 tablets Day 7 & 8 take 3 tablets Day 9 & 10 take 2 tablets Day 11 & 12 take 1 tablet Day 13 & 14 take 1/2 tablet   fluconazole  (DIFLUCAN ) 150 MG tablet Take 1 tablet PO for vaginal yeast infection;  repeat in 4 days if symptoms continue. (Patient not taking: Reported on 05/26/2023)   No facility-administered medications prior to visit.   Last CBC Lab Results  Component Value Date   WBC 7.5 02/21/2023   HGB 14.4 02/21/2023   HCT 43.6 02/21/2023   MCV 96 02/21/2023   MCH 31.6 02/21/2023   RDW 12.7 02/21/2023   PLT 346 02/21/2023   Last metabolic panel Lab Results  Component Value Date   GLUCOSE 103 (H) 02/21/2023   NA 141 02/21/2023   K 4.3 02/21/2023   CL 103 02/21/2023   CO2 24 02/21/2023   BUN 14 02/21/2023   CREATININE 0.93 02/21/2023   EGFR 71 02/21/2023   CALCIUM  9.4 02/21/2023   PROT 6.8 02/21/2023   ALBUMIN 4.6 02/21/2023   LABGLOB 2.2 02/21/2023   AGRATIO 2.0 08/01/2022   BILITOT 0.4 02/21/2023   ALKPHOS 92 02/21/2023   AST 21 02/21/2023   ALT 20 02/21/2023   ANIONGAP 9 10/13/2020   Last lipids Lab Results  Component Value Date   CHOL 255 (H) 02/21/2023   HDL 43 02/21/2023   LDLCALC 159 (H) 02/21/2023   LDLDIRECT 176 (H) 01/30/2019   TRIG 285 (H) 02/21/2023   CHOLHDL 5.9 (H) 02/21/2023   Last hemoglobin A1c Lab Results  Component Value Date   HGBA1C 6.2 (H) 02/21/2023  Last thyroid  functions Lab Results  Component Value Date   TSH 0.441 (L) 02/21/2023      Objective    BP 100/63 (BP Location: Right Arm, Patient Position: Sitting, Cuff Size: Normal)   Pulse 73   Ht 5' 2 (1.575 m)   Wt 146 lb (66.2 kg)   BMI 26.70 kg/m   BP Readings from Last 3 Encounters:  05/26/23 100/63  05/10/23 114/68  03/24/23 (!) 148/89   Wt Readings from Last 3 Encounters:  05/26/23 146 lb (66.2 kg)  05/10/23 151 lb 8 oz (68.7 kg)  03/24/23 151 lb (68.5 kg)   SpO2 Readings from Last 3 Encounters:  05/10/23 98%  02/21/23 99%  08/01/22 100%   Physical Exam Vitals and nursing note reviewed.  Constitutional:      General: She is not in acute distress.    Appearance: Normal appearance. She is overweight. She is not ill-appearing, toxic-appearing or  diaphoretic.  HENT:     Head: Normocephalic and atraumatic.  Cardiovascular:     Rate and Rhythm: Normal rate and regular rhythm.     Pulses: Normal pulses.     Heart sounds: Normal heart sounds. No murmur heard.    No friction rub. No gallop.  Pulmonary:     Effort: Pulmonary effort is normal. No respiratory distress.     Breath sounds: Normal breath sounds. No stridor. No wheezing, rhonchi or rales.  Chest:     Chest wall: No tenderness.  Abdominal:     General: Bowel sounds are normal.     Palpations: Abdomen is soft.     Tenderness: There is no right CVA tenderness or left CVA tenderness.  Musculoskeletal:        General: Tenderness present. No swelling, deformity or signs of injury. Normal range of motion.     Right lower leg: No edema.     Left lower leg: No edema.     Comments: Low back tenderness; mid to left side dominant   Skin:    General: Skin is warm and dry.     Capillary Refill: Capillary refill takes less than 2 seconds.     Coloration: Skin is not jaundiced or pale.     Findings: No bruising, erythema, lesion or rash.  Neurological:     General: No focal deficit present.     Mental Status: She is alert and oriented to person, place, and time. Mental status is at baseline.     Cranial Nerves: No cranial nerve deficit.     Sensory: No sensory deficit.     Motor: No weakness.     Coordination: Coordination normal.  Psychiatric:        Mood and Affect: Mood normal.        Behavior: Behavior normal.        Thought Content: Thought content normal.        Judgment: Judgment normal.     No results found for any visits on 05/26/23.  Assessment & Plan     Problem List Items Addressed This Visit       Cardiovascular and Mediastinum   Primary hypertension   Chronic, at goal Continue Zestoretic  10-12.5 mg daily         Endocrine   Adult hypothyroidism   Chronic, previously borderline Synthroid  88 mcg; repeat TSH      Relevant Orders   TSH     Other    Acute bilateral low back pain without sciatica - Primary   Recommend  trial of NSAIDs and pred burst; has PRN baclofen  at home from shoulder injury Xray ordered; recommend f/u with sports med in 2 weeks Exercises attached to AVS Denies loss of bowel or bladder CTM; will need f/u with new PCP      Relevant Medications   predniSONE  (DELTASONE ) 50 MG tablet   diclofenac  (VOLTAREN ) 75 MG EC tablet   Other Relevant Orders   DG Lumbar Spine Complete   Ambulatory referral to Sports Medicine   Mixed hyperlipidemia   Chronic, repeat LP The 10-year ASCVD risk score (Arnett DK, et al., 2019) is: 7.3%  At this time is on zetia  10 mg; tricor  145 mg; crestor  40 mg daily      Relevant Orders   Lipid panel   Prediabetes   Chronic, repeat A1c Continue to recommend balanced, lower carb meals. Smaller meal size, adding snacks. Choosing water as drink of choice and increasing purposeful exercise.       Relevant Orders   Hemoglobin A1c   Tobacco dependence   Chronic, improving Declines assistance with cessation efforts at this time      No follow-ups on file.     LILLETTE Kelly ONEIDA Emilio, FNP, have reviewed all documentation for this visit. The documentation on 05/26/23 for the exam, diagnosis, procedures, and orders are all accurate and complete.  Kelly ONEIDA Emilio, FNP  [facility] [phone] (phone) [phone] (fax)  [facility]

## 2023-05-26 NOTE — Assessment & Plan Note (Signed)
 Chronic, improving Declines assistance with cessation efforts at this time

## 2023-05-26 NOTE — Assessment & Plan Note (Signed)
 Chronic, repeat LP The 10-year ASCVD risk score (Arnett DK, et al., 2019) is: 7.3%  At this time is on zetia 10 mg; tricor 145 mg; crestor 40 mg daily

## 2023-05-26 NOTE — Assessment & Plan Note (Signed)
 Chronic, at goal Continue Zestoretic 10-12.5 mg daily

## 2023-05-27 LAB — LIPID PANEL
Chol/HDL Ratio: 7.6 {ratio} — ABNORMAL HIGH (ref 0.0–4.4)
Cholesterol, Total: 311 mg/dL — ABNORMAL HIGH (ref 100–199)
HDL: 41 mg/dL (ref >39)
LDL Chol Calc (NIH): 182 mg/dL — ABNORMAL HIGH (ref 0–99)
Triglycerides: 436 mg/dL — ABNORMAL HIGH (ref 0–149)
VLDL Cholesterol Cal: 88 mg/dL — ABNORMAL HIGH (ref 5–40)

## 2023-05-27 LAB — HEMOGLOBIN A1C
Est. average glucose Bld gHb Est-mCnc: 143 mg/dL
Hgb A1c MFr Bld: 6.6 % — ABNORMAL HIGH (ref 4.8–5.6)

## 2023-05-27 LAB — TSH: TSH: 0.325 u[IU]/mL — ABNORMAL LOW (ref 0.450–4.500)

## 2023-05-30 ENCOUNTER — Ambulatory Visit (INDEPENDENT_AMBULATORY_CARE_PROVIDER_SITE_OTHER): Payer: 59 | Admitting: Family Medicine

## 2023-05-30 ENCOUNTER — Encounter: Payer: Self-pay | Admitting: Family Medicine

## 2023-05-30 ENCOUNTER — Telehealth: Payer: Self-pay | Admitting: Family Medicine

## 2023-05-30 VITALS — BP 128/70 | HR 84 | Ht 62.0 in | Wt 149.0 lb

## 2023-05-30 DIAGNOSIS — M47817 Spondylosis without myelopathy or radiculopathy, lumbosacral region: Secondary | ICD-10-CM | POA: Diagnosis not present

## 2023-05-30 NOTE — Assessment & Plan Note (Addendum)
 History of Present Illness Mary Miller, a patient with a history of hypertension and hyperlipidemia, presents with low back pain that has been ongoing for about a year. The pain is localized in the lower back, above the tailbone, and does not radiate down the legs. The patient reports that the pain is exacerbated by sitting and is relieved by heat and Tylenol . The patient was previously diagnosed with diverticulitis when she presented for lower torso pain, which was treated with antibiotics. The antibiotics resolved the abdominal pain associated with the diverticulitis, but the low back pain persisted. The patient also reports occasional pain in the outer thigh if they lay on it for a long period of time, but this symptom has not been present recently. The patient denies any other significant changes in activity or work that could have precipitated the back pain.  When asked about onset, she states that when symptoms initially started, they were more severe and did not involve radiation down the lateral leg to the toes, those symptoms took several weeks to resolve and have not recurred.  Physical Exam MUSCULOSKELETAL: Negative straight leg raise on both left and right with slight tightness throughout posterior chain distribution bilaterally, no pain. Negative piriformis test on left with slight tightness, equivocal to positive piriformis test on right with slight tightness. Positive FADIR test on both left and right, pain localized to anterolateral aspect of hip.  There is tenderness at right sacroiliac joint. Positive tenderness at right piriformis. Kemp's test positive bilaterally, localizing to sacral region.  Sensorimotor intact bilateral lower extremities.  Results RADIOLOGY Lumbar spine X-ray: Normal alignment, minor degenerative changes throughout the lower lumbar spine, more prominent at L4, L5-S1, incidental finding of aortic calcification (05/26/2023)  Assessment and Plan Lumbosacral  spondylosis Chronic low back pain, likely secondary to lumbar disc degeneration given initial radicular features, and lumbosacral arthritis. No current radicular symptoms. Positive Kemp's test localizing to sacral region. Positive tenderness at right piriformis. -Start Prednisone  for 5 days to reduce inflammation. -After Prednisone , start Diclofenac  twice daily as needed for pain. -Implement home exercises to improve flexibility and strength. -If no improvement by end of February 2025, contact our office for next steps -Consider ordering an MRI and referral to interventional spine for possible steroid injection.   Incidental finding of calcific changes on abdominal aorta Noted calcification on imaging.  Patient reports no current symptoms related to this finding. -Recommend touching base with her primary care provider. -Will send message to patient's PCP regarding these incidental findings. -Continue efforts to manage cholesterol and blood pressure.

## 2023-05-30 NOTE — Patient Instructions (Signed)
 Low Back Pain: Start Prednisone  for 5 days. Use Diclofenac  twice daily as needed for pain. Begin home exercises to improve flexibility and strength. Consider MRI and specialist referral for possible steroid injections if no improvement by end of February 2025.  Atherosclerosis: Primary care provider notified, check in with them regarding this as well. Continue managing cholesterol and blood pressure.

## 2023-05-30 NOTE — Progress Notes (Signed)
 Primary Care / Sports Medicine Office Visit  Patient Information:  Patient ID: Mary Miller, female DOB: 10/23/1965 Age: 58 y.o. MRN: [id number]   Mary Miller is a pleasant 58 y.o. female presenting with the following:  Chief Complaint  Patient presents with   Back Pain    Bil low back pain , imaging on 05/26/23. No radicular pain. Sitting is a aggravating factor. Alleviating factors heat and tylenol .     Vitals:   05/30/23 1508 05/30/23 1519  BP: (!) 140/76 128/70  Pulse: 84   SpO2: 95%    Vitals:   05/30/23 1508  Weight: 149 lb (67.6 kg)  Height: 5' 2 (1.575 m)   Body mass index is 27.25 kg/m.  No results found.   Independent interpretation of notes and tests performed by another provider:   Lumbar spine X-ray: Normal alignment, minor degenerative changes throughout the lower lumbar spine, more prominent at L4, L5-S1, incidental finding of aortic calcification (05/26/2023)  Procedures performed:   None  Pertinent History, Exam, Impression, and Recommendations:   Problem List Items Addressed This Visit     Spondylosis of lumbosacral region without myelopathy or radiculopathy - Primary   History of Present Illness Mary Miller, a patient with a history of hypertension and hyperlipidemia, presents with low back pain that has been ongoing for about a year. The pain is localized in the lower back, above the tailbone, and does not radiate down the legs. The patient reports that the pain is exacerbated by sitting and is relieved by heat and Tylenol . The patient was previously diagnosed with diverticulitis when she presented for lower torso pain, which was treated with antibiotics. The antibiotics resolved the abdominal pain associated with the diverticulitis, but the low back pain persisted. The patient also reports occasional pain in the outer thigh if they lay on it for a long period of time, but this symptom has not been present recently. The patient denies any  other significant changes in activity or work that could have precipitated the back pain.  When asked about onset, she states that when symptoms initially started, they were more severe and did not involve radiation down the lateral leg to the toes, those symptoms took several weeks to resolve and have not recurred.  Physical Exam MUSCULOSKELETAL: Negative straight leg raise on both left and right with slight tightness throughout posterior chain distribution bilaterally, no pain. Negative piriformis test on left with slight tightness, equivocal to positive piriformis test on right with slight tightness. Positive FADIR test on both left and right, pain localized to anterolateral aspect of hip.  There is tenderness at right sacroiliac joint. Positive tenderness at right piriformis. Kemp's test positive bilaterally, localizing to sacral region.  Sensorimotor intact bilateral lower extremities.  Results RADIOLOGY Lumbar spine X-ray: Normal alignment, minor degenerative changes throughout the lower lumbar spine, more prominent at L4, L5-S1, incidental finding of aortic calcification (05/26/2023)  Assessment and Plan Lumbosacral spondylosis Chronic low back pain, likely secondary to lumbar disc degeneration given initial radicular features, and lumbosacral arthritis. No current radicular symptoms. Positive Kemp's test localizing to sacral region. Positive tenderness at right piriformis. -Start Prednisone  for 5 days to reduce inflammation. -After Prednisone , start Diclofenac  twice daily as needed for pain. -Implement home exercises to improve flexibility and strength. -If no improvement by end of February 2025, contact our office for next steps -Consider ordering an MRI and referral to interventional spine for possible steroid injection.   Incidental finding of calcific  changes on abdominal aorta Noted calcification on imaging.  Patient reports no current symptoms related to this finding. -Recommend  touching base with her primary care provider. -Will send message to patient's PCP regarding these incidental findings. -Continue efforts to manage cholesterol and blood pressure.        Orders & Medications Medications: No orders of the defined types were placed in this encounter.  No orders of the defined types were placed in this encounter.    No follow-ups on file.     Selinda JINNY Ku, MD, [facility]   Primary Care Sports Medicine Primary Care and Sports Medicine at [facility]

## 2023-06-01 NOTE — Telephone Encounter (Signed)
 Patient on statin and zetia and BP controlling agents. Can discuss further with new PCP

## 2023-06-29 ENCOUNTER — Telehealth: Payer: 59 | Admitting: Physician Assistant

## 2023-06-29 DIAGNOSIS — J069 Acute upper respiratory infection, unspecified: Secondary | ICD-10-CM

## 2023-06-29 MED ORDER — BENZONATATE 100 MG PO CAPS: 100.0000 mg | ORAL_CAPSULE | Freq: Three times a day (TID) | ORAL | 0 refills | Status: DC | PRN

## 2023-06-29 NOTE — Progress Notes (Signed)
 I have spent 5 minutes in review of e-visit questionnaire, review and updating patient chart, medical decision making and response to patient.   Piedad Climes, PA-C

## 2023-06-29 NOTE — Progress Notes (Signed)

## 2023-07-03 ENCOUNTER — Telehealth: Payer: Self-pay | Admitting: Family Medicine

## 2023-07-03 NOTE — Telephone Encounter (Signed)
 [facility] faxed refill request for the following medications:  Diclofenac  SOD EC 75mg  tab    Please advise.

## 2023-07-04 NOTE — Telephone Encounter (Signed)
Medication not on active list. Patient reported not taking.   Patient not taking: Reported on 05/30/2023       Start Date: 05/26/23 End Date: 06/29/23  Discontinued by: Waldon Merl, PA-C on 06/29/2023 10:32         Attempted to call patient to see if she is requesting this refill. No answer. Left VM for her to return call to office. PEC if she returns call, you could please confirm this request.

## 2023-07-13 ENCOUNTER — Telehealth: Payer: Self-pay | Admitting: Physician Assistant

## 2023-07-13 ENCOUNTER — Other Ambulatory Visit: Payer: Self-pay

## 2023-07-13 ENCOUNTER — Other Ambulatory Visit: Payer: Self-pay | Admitting: Family Medicine

## 2023-07-13 DIAGNOSIS — M7542 Impingement syndrome of left shoulder: Secondary | ICD-10-CM

## 2023-07-13 DIAGNOSIS — F39 Unspecified mood [affective] disorder: Secondary | ICD-10-CM

## 2023-07-13 DIAGNOSIS — F419 Anxiety disorder, unspecified: Secondary | ICD-10-CM

## 2023-07-13 DIAGNOSIS — G4700 Insomnia, unspecified: Secondary | ICD-10-CM

## 2023-07-13 DIAGNOSIS — F32A Depression, unspecified: Secondary | ICD-10-CM

## 2023-07-13 MED ORDER — BACLOFEN 20 MG PO TABS: 20.0000 mg | ORAL_TABLET | Freq: Three times a day (TID) | ORAL | 0 refills | Status: DC | PRN

## 2023-07-13 MED ORDER — QUETIAPINE FUMARATE 100 MG PO TABS: 100.0000 mg | ORAL_TABLET | Freq: Every day | ORAL | 3 refills | Status: DC

## 2023-07-13 MED ORDER — TRAZODONE HCL 100 MG PO TABS: ORAL_TABLET | ORAL | 3 refills | Status: DC

## 2023-07-13 NOTE — Telephone Encounter (Addendum)
 [facility] faxed refill request for the following medications:   QUEtiapine (SEROQUEL) 100 MG tablet     traZODone (DESYREL) 100 MG tablet    baclofen (LIORESAL) 20 MG tablet    Please advise.

## 2023-07-13 NOTE — Telephone Encounter (Signed)
 Patient also requesting Baclofen but this is not on her list

## 2023-07-13 NOTE — Telephone Encounter (Signed)
 [facility] is requesting refill traZODone (DESYREL) 100 MG tablet   Please advise

## 2023-08-07 ENCOUNTER — Telehealth: Payer: Self-pay | Admitting: Family Medicine

## 2023-08-07 DIAGNOSIS — M545 Low back pain, unspecified: Secondary | ICD-10-CM

## 2023-08-07 NOTE — Telephone Encounter (Signed)
 [facility] faxed refill request for the following medications:   diclofenac (VOLTAREN) 75 MG EC tablet     Please advise.

## 2023-08-09 ENCOUNTER — Encounter: Payer: Self-pay | Admitting: Physician Assistant

## 2023-08-09 DIAGNOSIS — M545 Low back pain, unspecified: Secondary | ICD-10-CM

## 2023-08-09 MED ORDER — DICLOFENAC SODIUM 75 MG PO TBEC: 75.0000 mg | DELAYED_RELEASE_TABLET | Freq: Two times a day (BID) | ORAL | 0 refills | Status: DC

## 2023-08-17 ENCOUNTER — Telehealth: Payer: Self-pay

## 2023-08-17 ENCOUNTER — Telehealth: Payer: Self-pay | Admitting: Family Medicine

## 2023-08-17 ENCOUNTER — Other Ambulatory Visit: Payer: Self-pay | Admitting: Family Medicine

## 2023-08-17 DIAGNOSIS — E782 Mixed hyperlipidemia: Secondary | ICD-10-CM

## 2023-08-17 DIAGNOSIS — E039 Hypothyroidism, unspecified: Secondary | ICD-10-CM

## 2023-08-17 DIAGNOSIS — M7542 Impingement syndrome of left shoulder: Secondary | ICD-10-CM

## 2023-08-17 NOTE — Telephone Encounter (Signed)
 Patient was seen in January and lab done at that time by Robynn Pane but I do not see any message from the provider.   Level was abnormal

## 2023-08-17 NOTE — Telephone Encounter (Signed)
 [facility] faxed refill request for the following medications:    levothyroxine (SYNTHROID) 88 MCG tablet   Please advise.

## 2023-08-27 NOTE — Addendum Note (Signed)
 Addended by: Jacquenette Shone on: 08/27/2023 11:59 AM   Modules accepted: Orders

## 2023-08-28 ENCOUNTER — Other Ambulatory Visit: Payer: Self-pay

## 2023-08-28 DIAGNOSIS — E039 Hypothyroidism, unspecified: Secondary | ICD-10-CM

## 2023-08-28 MED ORDER — DICLOFENAC SODIUM 75 MG PO TBEC: 75.0000 mg | DELAYED_RELEASE_TABLET | Freq: Two times a day (BID) | ORAL | 0 refills | Status: DC

## 2023-08-29 MED ORDER — LEVOTHYROXINE SODIUM 88 MCG PO TABS: ORAL_TABLET | ORAL | 0 refills | Status: DC

## 2023-09-23 ENCOUNTER — Other Ambulatory Visit: Payer: Self-pay | Admitting: Family Medicine

## 2023-09-23 DIAGNOSIS — E039 Hypothyroidism, unspecified: Secondary | ICD-10-CM

## 2023-10-22 ENCOUNTER — Other Ambulatory Visit: Payer: Self-pay | Admitting: Family Medicine

## 2023-10-22 DIAGNOSIS — G4700 Insomnia, unspecified: Secondary | ICD-10-CM

## 2023-10-22 DIAGNOSIS — E039 Hypothyroidism, unspecified: Secondary | ICD-10-CM

## 2023-10-22 DIAGNOSIS — F32A Depression, unspecified: Secondary | ICD-10-CM

## 2023-10-22 DIAGNOSIS — F39 Unspecified mood [affective] disorder: Secondary | ICD-10-CM

## 2023-10-22 DIAGNOSIS — F419 Anxiety disorder, unspecified: Secondary | ICD-10-CM

## 2023-11-24 ENCOUNTER — Other Ambulatory Visit: Payer: Self-pay | Admitting: Family Medicine

## 2023-11-24 DIAGNOSIS — F39 Unspecified mood [affective] disorder: Secondary | ICD-10-CM

## 2023-11-24 DIAGNOSIS — F32A Depression, unspecified: Secondary | ICD-10-CM

## 2023-11-24 DIAGNOSIS — F419 Anxiety disorder, unspecified: Secondary | ICD-10-CM

## 2023-11-24 DIAGNOSIS — G4700 Insomnia, unspecified: Secondary | ICD-10-CM

## 2023-11-28 ENCOUNTER — Other Ambulatory Visit: Payer: Self-pay | Admitting: Family Medicine

## 2023-11-28 DIAGNOSIS — E039 Hypothyroidism, unspecified: Secondary | ICD-10-CM

## 2023-12-01 ENCOUNTER — Encounter: Payer: Self-pay | Admitting: Family Medicine

## 2023-12-01 NOTE — Telephone Encounter (Signed)
 Please review and advise.   JM

## 2023-12-06 LAB — LIPID PANEL
Chol/HDL Ratio: 2.5 ratio (ref 0.0–4.4)
Cholesterol, Total: 92 mg/dL — ABNORMAL LOW (ref 100–199)
HDL: 37 mg/dL — ABNORMAL LOW (ref >39)
LDL Chol Calc (NIH): 28 mg/dL (ref 0–99)
Triglycerides: 166 mg/dL — ABNORMAL HIGH (ref 0–149)
VLDL Cholesterol Cal: 27 mg/dL (ref 5–40)

## 2023-12-06 LAB — TSH: TSH: 0.1 u[IU]/mL — ABNORMAL LOW (ref 0.450–4.500)

## 2023-12-07 ENCOUNTER — Ambulatory Visit: Payer: Self-pay | Admitting: Family Medicine

## 2023-12-07 DIAGNOSIS — E039 Hypothyroidism, unspecified: Secondary | ICD-10-CM

## 2023-12-07 MED ORDER — LEVOTHYROXINE SODIUM 75 MCG PO TABS: 75.0000 ug | ORAL_TABLET | Freq: Every day | ORAL | 0 refills | Status: DC

## 2023-12-08 ENCOUNTER — Ambulatory Visit: Payer: Self-pay | Admitting: Family Medicine

## 2023-12-25 ENCOUNTER — Telehealth: Payer: Self-pay | Admitting: Family Medicine

## 2023-12-25 ENCOUNTER — Other Ambulatory Visit: Payer: Self-pay

## 2023-12-25 DIAGNOSIS — E782 Mixed hyperlipidemia: Secondary | ICD-10-CM

## 2023-12-25 DIAGNOSIS — E7800 Pure hypercholesterolemia, unspecified: Secondary | ICD-10-CM

## 2023-12-25 MED ORDER — ROSUVASTATIN CALCIUM 40 MG PO TABS
40.0000 mg | ORAL_TABLET | Freq: Every day | ORAL | 0 refills | Status: AC
Start: 2023-12-25 — End: ?

## 2023-12-25 MED ORDER — EZETIMIBE 10 MG PO TABS: 10.0000 mg | ORAL_TABLET | Freq: Every day | ORAL | 0 refills | Status: AC

## 2023-12-25 NOTE — Telephone Encounter (Addendum)
 [facility] faxed refill request for the following medications:   rosuvastatin  (CRESTOR ) 40 MG tablet     ezetimibe  (ZETIA ) 10 MG tablet     Please advise.

## 2023-12-25 NOTE — Telephone Encounter (Signed)
Converted to refill request 

## 2023-12-31 ENCOUNTER — Other Ambulatory Visit: Payer: Self-pay | Admitting: Family Medicine

## 2023-12-31 DIAGNOSIS — F32A Depression, unspecified: Secondary | ICD-10-CM

## 2023-12-31 DIAGNOSIS — G4700 Insomnia, unspecified: Secondary | ICD-10-CM

## 2023-12-31 DIAGNOSIS — F39 Unspecified mood [affective] disorder: Secondary | ICD-10-CM

## 2023-12-31 DIAGNOSIS — F419 Anxiety disorder, unspecified: Secondary | ICD-10-CM

## 2024-01-05 ENCOUNTER — Ambulatory Visit: Payer: Self-pay | Admitting: Family Medicine

## 2024-01-05 VITALS — BP 105/69 | HR 70 | Resp 14 | Ht 62.0 in | Wt 150.2 lb

## 2024-01-05 DIAGNOSIS — G4700 Insomnia, unspecified: Secondary | ICD-10-CM

## 2024-01-05 DIAGNOSIS — M2550 Pain in unspecified joint: Secondary | ICD-10-CM

## 2024-01-05 DIAGNOSIS — I10 Essential (primary) hypertension: Secondary | ICD-10-CM

## 2024-01-05 DIAGNOSIS — F39 Unspecified mood [affective] disorder: Secondary | ICD-10-CM

## 2024-01-05 DIAGNOSIS — M47817 Spondylosis without myelopathy or radiculopathy, lumbosacral region: Secondary | ICD-10-CM

## 2024-01-05 DIAGNOSIS — R7303 Prediabetes: Secondary | ICD-10-CM

## 2024-01-05 DIAGNOSIS — E039 Hypothyroidism, unspecified: Secondary | ICD-10-CM

## 2024-01-05 DIAGNOSIS — F17200 Nicotine dependence, unspecified, uncomplicated: Secondary | ICD-10-CM

## 2024-01-05 DIAGNOSIS — E782 Mixed hyperlipidemia: Secondary | ICD-10-CM

## 2024-01-05 MED ORDER — MELOXICAM 7.5 MG PO TABS: 7.5000 mg | ORAL_TABLET | Freq: Every day | ORAL | 1 refills | Status: AC

## 2024-01-05 NOTE — Progress Notes (Signed)
 Established Patient Office Visit  Introduced to nurse practitioner role and practice setting.  All questions answered.  Discussed provider/patient relationship and expectations.   Subjective   Patient ID: CARLYON NOLASCO, female    DOB: 09/24/1965  Age: 58 y.o. MRN: [id number]  Chief Complaint  Patient presents with   Annual Exam    Pattern of eating: watches what she eats, drinks a lot of water  Pattern of sleep: 7-8 hours Are you exercising: yes  What type of exercising: walking  How long: 30 mins  How frequent: 5 days a week   No mammogram due to no ibsurance Vaccine: will go to pharmacy or Health department   Labs Only    Repeat TSH per Dr. Donzella note     Discussed the use of AI scribe software for clinical note transcription with the patient, who gave verbal consent to proceed.  History of Present Illness Dalma Panchal is a 58 year old female with hypothyroidism who presents with concerns about medication adjustment and associated symptoms.  She recently experienced headaches and achiness in her hand following a change in her Synthroid  dosage from 88 mcg to 75 mcg. These symptoms began less than a month ago after the dosage adjustment. She questions whether these symptoms are related to the medication change. Her hand had previously undergone carpal tunnel surgery.  She has a history of elevated lipid levels, which have improved with medication. She is currently on three cholesterol medications. There was a previous concern about her lipid levels being elevated despite medication, but recent improvements have been noted.  She has a history of prediabetes, with her last A1c level recorded at 6.6 about seven months ago, which is considered diabetic. She reports no family history of diabetes, except for a niece on her father's side. She mentions being under a lot of stress at the time of the last A1c test.  She takes Seroquel  nightly for sleep and trazodone  as  needed. She previously used diclofenac  for back pain but has run out of it. The diclofenac  was effective in managing her pain.  Her father recently suffered a left-sided MCA stroke, affecting his communication abilities but not his mobility. This has been a source of stress for her.      01/05/2024    1:10 PM 05/30/2023    3:16 PM 05/26/2023    9:48 AM  Depression screen PHQ 2/9  Decreased Interest 0 0 0  Down, Depressed, Hopeless 0 0 0  PHQ - 2 Score 0 0 0  Altered sleeping 0  0  Tired, decreased energy 1  0  Change in appetite 0  0  Feeling bad or failure about yourself  0  0  Trouble concentrating 0  0  Moving slowly or fidgety/restless 0  0  Suicidal thoughts 0  0  PHQ-9 Score 1  0  Difficult doing work/chores Somewhat difficult  Not difficult at all       01/05/2024    1:10 PM 05/30/2023    3:17 PM 05/26/2023    9:48 AM 03/24/2023    8:22 AM  GAD 7 : Generalized Anxiety Score  Nervous, Anxious, on Edge 1 0 0 1  Control/stop worrying 0 0 0 1  Worry too much - different things 1 0 0 1  Trouble relaxing 1 0 0 0  Restless 0 0 0 0  Easily annoyed or irritable 0 0 0 0  Afraid - awful might happen 0 0 0 0  Total GAD 7 Score 3 0 0 3  Anxiety Difficulty Not difficult at all Not difficult at all Not difficult at all Not difficult at all     ROS  Negative unless indicated in HPI   Objective:     BP 105/69 (BP Location: Left Arm, Patient Position: Sitting, Cuff Size: Normal)   Pulse 70   Resp 14   Ht 5' 2 (1.575 m)   Wt 150 lb 3.2 oz (68.1 kg)   SpO2 100%   BMI 27.47 kg/m    Physical Exam Constitutional:      General: She is not in acute distress.    Appearance: Normal appearance. She is overweight. She is not toxic-appearing or diaphoretic.  HENT:     Head: Normocephalic.     Nose: Nose normal.     Mouth/Throat:     Mouth: Mucous membranes are moist.     Pharynx: Oropharynx is clear.  Eyes:     Extraocular Movements: Extraocular movements intact.     Pupils:  Pupils are equal, round, and reactive to light.  Neck:     Thyroid : No thyroid  mass or thyromegaly.     Vascular: No carotid bruit.  Cardiovascular:     Rate and Rhythm: Normal rate and regular rhythm.     Pulses: Normal pulses.     Heart sounds: Normal heart sounds. No murmur heard.    No friction rub. No gallop.  Pulmonary:     Effort: No respiratory distress.     Breath sounds: No stridor. No wheezing, rhonchi or rales.  Chest:     Chest wall: No tenderness.  Musculoskeletal:     Right lower leg: No edema.     Left lower leg: No edema.  Lymphadenopathy:     Cervical: No cervical adenopathy.  Skin:    General: Skin is warm and dry.     Capillary Refill: Capillary refill takes less than 2 seconds.  Neurological:     General: No focal deficit present.     Mental Status: She is alert and oriented to person, place, and time. Mental status is at baseline.     Cranial Nerves: No cranial nerve deficit.     Sensory: No sensory deficit.     Motor: No weakness.     Gait: Gait normal.  Psychiatric:        Mood and Affect: Mood normal.        Behavior: Behavior normal.        Thought Content: Thought content normal.        Judgment: Judgment normal.      No results found for any visits on 01/05/24.    The ASCVD Risk score (Arnett DK, et al., 2019) failed to calculate for the following reasons:   The valid total cholesterol range is 130 to 320 mg/dL    Assessment & Plan:  Primary hypertension -     Comprehensive metabolic panel with GFR  Prediabetes -     Hemoglobin A1c -     Microalbumin / creatinine urine ratio  Adult hypothyroidism -     TSH  Arthralgia, unspecified joint -     Sedimentation rate -     C-reactive protein  Mood disorder (HCC)  Mixed hyperlipidemia  Spondylosis of lumbosacral region without myelopathy or radiculopathy  Tobacco dependence  Insomnia, unspecified type     Assessment and Plan Assessment & Plan Hypothyroidism Managed with  Synthroid . Recent labs show low TSH in July 2025, dose was  adjusted down to 75mcg daily, which she has been on now for about 4 weeks. She reports headaches and hand achiness since the dose change, possibly related to medication adjustment. Most likely from stopping her diclofenac . - Recheck thyroid  levels to assess current TSH status. - Consider thyroid  ultrasound if TSH levels remain low. - Refer to endocrinology if thyroid  levels are not stabilized.  Type 2 diabetes mellitus Last A1c was 6.6. No family history of diabetes. Reports not consuming sweets or sodas. Stress may have contributed to elevated levels. - Check A1c to reassess diabetes control. - Check urine for protein to assess for diabetic nephropathy. - Continue to make conscious decisions for well balanced diet smaller portions with increase protein, fruits, veggies, water as drink of choice, decrease starches, processed foods, and saturated fats. Increase weekly exercise - 150 minutes per week.  - May need to start medication based on A1c level  Hyperlipidemia Lipid levels have improved with current medication regimen. - Continue ezetimbe 10mg  daily - Continue fenofibrate  145mg  - Continue Rosuvastatin  40mg  daily - Continue heart health diet, limiting processed foods and sodiums  Chronic Back pain Previously managed with diclofenac , which she has run out of. Achiness may be related to discontinuation. Reports hand achiness and history of carpal tunnel surgery. - Prescribe meloxicam  7.5 mg daily as an alternative to diclofenac . - Check CMP for kidney function due to previous NSAID use.  Hypertension Blood pressure is well-controlled. - Chronic stable - GOAL<130/80 - Continue lisinopril  hydrochlorothiazide  10/12.5 - if continues to be normal range, may reduce at next visit - Monitor blood pressure at home if possible.  Insomnia - continue prn trazodone  100mg   Mood - Continue Seroquel  100mg  nightly  Tobacco Use Chronic,  improving Declines assistance with cessation efforts at this time    Return in about 3 months (around 04/06/2024) for Chronic Disease MGMT - labs.   I, Curtis DELENA Boom, FNP, have reviewed all documentation for this visit. The documentation on 01/05/24 for the exam, diagnosis, procedures, and orders are all accurate and complete.   Curtis DELENA Boom, FNP

## 2024-01-06 ENCOUNTER — Ambulatory Visit: Payer: Self-pay | Admitting: Family Medicine

## 2024-01-06 DIAGNOSIS — E039 Hypothyroidism, unspecified: Secondary | ICD-10-CM

## 2024-01-06 DIAGNOSIS — E049 Nontoxic goiter, unspecified: Secondary | ICD-10-CM

## 2024-01-06 LAB — COMPREHENSIVE METABOLIC PANEL WITH GFR
ALT: 40 IU/L — ABNORMAL HIGH (ref 0–32)
AST: 32 IU/L (ref 0–40)
Albumin: 4.7 g/dL (ref 3.8–4.9)
Alkaline Phosphatase: 79 IU/L (ref 44–121)
BUN/Creatinine Ratio: 15 (ref 9–23)
BUN: 19 mg/dL (ref 6–24)
Bilirubin Total: 0.4 mg/dL (ref 0.0–1.2)
CO2: 23 mmol/L (ref 20–29)
Calcium: 9.4 mg/dL (ref 8.7–10.2)
Chloride: 101 mmol/L (ref 96–106)
Creatinine, Ser: 1.23 mg/dL — ABNORMAL HIGH (ref 0.57–1.00)
Globulin, Total: 2.2 g/dL (ref 1.5–4.5)
Glucose: 106 mg/dL — ABNORMAL HIGH (ref 70–99)
Potassium: 4.2 mmol/L (ref 3.5–5.2)
Sodium: 138 mmol/L (ref 134–144)
Total Protein: 6.9 g/dL (ref 6.0–8.5)
eGFR: 51 mL/min/1.73 — ABNORMAL LOW (ref >59)

## 2024-01-06 LAB — HEMOGLOBIN A1C
Est. average glucose Bld gHb Est-mCnc: 131 mg/dL
Hgb A1c MFr Bld: 6.2 % — ABNORMAL HIGH (ref 4.8–5.6)

## 2024-01-06 LAB — MICROALBUMIN / CREATININE URINE RATIO
Creatinine, Urine: 30.2 mg/dL
Microalb/Creat Ratio: 22 mg/g{creat} (ref 0–29)
Microalbumin, Urine: 6.6 ug/mL

## 2024-01-06 LAB — TSH: TSH: 0.178 u[IU]/mL — ABNORMAL LOW (ref 0.450–4.500)

## 2024-01-06 LAB — C-REACTIVE PROTEIN: CRP: <1 mg/L (ref 0–10)

## 2024-01-06 LAB — SEDIMENTATION RATE: Sed Rate: 5 mm/h (ref 0–40)

## 2024-01-06 MED ORDER — LEVOTHYROXINE SODIUM 50 MCG PO TABS: 50.0000 ug | ORAL_TABLET | Freq: Every day | ORAL | 0 refills | Status: DC

## 2024-01-08 NOTE — Progress Notes (Signed)
 Seen by patient ARLY SALMINEN on 01/06/2024  6:45 PM

## 2024-01-12 ENCOUNTER — Encounter: Payer: Self-pay | Admitting: Family Medicine

## 2024-01-15 ENCOUNTER — Ambulatory Visit: Payer: Self-pay

## 2024-01-31 ENCOUNTER — Other Ambulatory Visit: Payer: Self-pay | Admitting: Family Medicine

## 2024-01-31 DIAGNOSIS — E039 Hypothyroidism, unspecified: Secondary | ICD-10-CM

## 2024-03-25 ENCOUNTER — Telehealth: Payer: Self-pay | Admitting: Family Medicine

## 2024-03-25 ENCOUNTER — Other Ambulatory Visit: Payer: Self-pay

## 2024-03-25 DIAGNOSIS — I10 Essential (primary) hypertension: Secondary | ICD-10-CM

## 2024-03-25 NOTE — Telephone Encounter (Signed)
[facility] faxed refill request for the following medications:  lisinopril-hydrochlorothiazide (ZESTORETIC) 10-12.5 MG tablet   Please advise.  

## 2024-03-25 NOTE — Telephone Encounter (Signed)
 Converted

## 2024-04-05 ENCOUNTER — Other Ambulatory Visit: Payer: Self-pay | Admitting: Family Medicine

## 2024-04-05 DIAGNOSIS — I10 Essential (primary) hypertension: Secondary | ICD-10-CM

## 2024-04-05 NOTE — Telephone Encounter (Signed)
 Copied from CRM [id number]. Topic: Clinical - Medication Refill >> Apr 05, 2024  4:39 PM Shanda MATSU wrote: Medication: lisinopril -hydrochlorothiazide  (ZESTORETIC ) 10-12.5 MG tablet  Has the patient contacted their pharmacy? Yes, pharmacy referred patient to provider (Agent: If no, request that the patient contact the pharmacy for the refill. If patient does not wish to contact the pharmacy document the reason why and proceed with request.) (Agent: If yes, when and what did the pharmacy advise?)  This is the patient's preferred pharmacy:  [facility] [phone] [address], [place] - [address] [place] [address] Phone: [phone] Fax: [phone]  Is this the correct pharmacy for this prescription? Yes If no, delete pharmacy and type the correct one.   Has the prescription been filled recently? No  Is the patient out of the medication? Yes  Has the patient been seen for an appointment in the last year OR does the patient have an upcoming appointment? Yes  Can we respond through MyChart? Yes  Agent: Please be advised that Rx refills may take up to 3 business days. We ask that you follow-up with your pharmacy.

## 2024-04-06 ENCOUNTER — Other Ambulatory Visit: Payer: Self-pay | Admitting: Family Medicine

## 2024-04-06 DIAGNOSIS — E039 Hypothyroidism, unspecified: Secondary | ICD-10-CM

## 2024-04-08 NOTE — Telephone Encounter (Signed)
 Too soon for refill.  Requested Prescriptions  Pending Prescriptions Disp Refills   lisinopril -hydrochlorothiazide  (ZESTORETIC ) 10-12.5 MG tablet 90 tablet 3    Sig: Take 1 tablet by mouth daily.     Cardiovascular:  ACEI + Diuretic Combos Failed - 04/08/2024  4:06 PM      Failed - Cr in normal range and within 180 days    Creatinine, Ser  Date Value Ref Range Status  01/05/2024 1.23 (H) 0.57 - 1.00 mg/dL Final         Passed - Na in normal range and within 180 days    Sodium  Date Value Ref Range Status  01/05/2024 138 134 - 144 mmol/L Final         Passed - K in normal range and within 180 days    Potassium  Date Value Ref Range Status  01/05/2024 4.2 3.5 - 5.2 mmol/L Final         Passed - eGFR is 30 or above and within 180 days    GFR calc Af Amer  Date Value Ref Range Status  03/30/2020 97 >59 mL/min/1.73 Final    Comment:    **In accordance with recommendations from the NKF-ASN Task force,**   [facility] is in the process of updating its eGFR calculation to the   2021 CKD-EPI creatinine equation that estimates kidney function   without a race variable.    GFR, Estimated  Date Value Ref Range Status  10/13/2020 >60 >60 mL/min Final    Comment:    (NOTE) Calculated using the CKD-EPI Creatinine Equation (2021)    eGFR  Date Value Ref Range Status  01/05/2024 51 (L) >59 mL/min/1.73 Final         Passed - Patient is not pregnant      Passed - Last BP in normal range    BP Readings from Last 1 Encounters:  01/05/24 105/69         Passed - Valid encounter within last 6 months    Recent Outpatient Visits           3 months ago Primary hypertension   [facility] [place], [place] [facility], [place]
